# Patient Record
Sex: Male | Born: 1960 | Race: Black or African American | Hispanic: No | Marital: Single | Smoking: Current some day smoker
Health system: Southern US, Community
[De-identification: ages and names within clinical notes are randomized; demographics above are authoritative.]

## PROBLEM LIST (undated history)

## (undated) DIAGNOSIS — F419 Anxiety disorder, unspecified: Secondary | ICD-10-CM

## (undated) DIAGNOSIS — E7800 Pure hypercholesterolemia, unspecified: Secondary | ICD-10-CM

## (undated) DIAGNOSIS — M549 Dorsalgia, unspecified: Secondary | ICD-10-CM

## (undated) DIAGNOSIS — I10 Essential (primary) hypertension: Secondary | ICD-10-CM

---

## 2003-11-27 ENCOUNTER — Emergency Department: Payer: Self-pay | Admitting: Emergency Medicine

## 2003-12-01 ENCOUNTER — Ambulatory Visit: Payer: Self-pay | Admitting: *Deleted

## 2003-12-14 ENCOUNTER — Emergency Department: Payer: Self-pay | Admitting: Emergency Medicine

## 2004-03-15 ENCOUNTER — Ambulatory Visit: Payer: Self-pay

## 2005-04-21 ENCOUNTER — Emergency Department: Payer: Self-pay | Admitting: General Practice

## 2007-06-01 ENCOUNTER — Emergency Department: Payer: Self-pay | Admitting: Emergency Medicine

## 2007-08-22 ENCOUNTER — Emergency Department: Payer: Self-pay | Admitting: Emergency Medicine

## 2009-06-17 ENCOUNTER — Emergency Department: Payer: Self-pay | Admitting: Emergency Medicine

## 2009-08-21 ENCOUNTER — Emergency Department: Payer: Self-pay | Admitting: Emergency Medicine

## 2010-02-26 ENCOUNTER — Emergency Department: Payer: Self-pay | Admitting: Internal Medicine

## 2010-05-16 ENCOUNTER — Emergency Department: Payer: Self-pay | Admitting: Emergency Medicine

## 2012-08-08 ENCOUNTER — Ambulatory Visit: Payer: Self-pay | Admitting: Orthopedic Surgery

## 2016-11-20 ENCOUNTER — Encounter: Payer: Self-pay | Admitting: Emergency Medicine

## 2016-11-20 ENCOUNTER — Emergency Department
Admission: EM | Admit: 2016-11-20 | Discharge: 2016-11-20 | Disposition: A | Discharge status: Home / Self Care | Payer: 59 | Attending: Emergency Medicine | Admitting: Emergency Medicine

## 2016-11-20 DIAGNOSIS — M62838 Other muscle spasm: Secondary | ICD-10-CM

## 2016-11-20 DIAGNOSIS — H579 Unspecified disorder of eye and adnexa: Secondary | ICD-10-CM | POA: Diagnosis present

## 2016-11-20 DIAGNOSIS — H1132 Conjunctival hemorrhage, left eye: Secondary | ICD-10-CM | POA: Diagnosis not present

## 2016-11-20 DIAGNOSIS — Z87891 Personal history of nicotine dependence: Secondary | ICD-10-CM | POA: Insufficient documentation

## 2016-11-20 DIAGNOSIS — J302 Other seasonal allergic rhinitis: Secondary | ICD-10-CM | POA: Diagnosis not present

## 2016-11-20 HISTORY — DX: Dorsalgia, unspecified: M54.9

## 2016-11-20 MED ORDER — BACLOFEN 10 MG PO TABS: 10.0000 mg | ORAL_TABLET | Freq: Every day | ORAL | 1 refills | Status: AC

## 2016-11-20 NOTE — ED Triage Notes (Signed)
Pt states that he woke up on Friday with bleeding in his left eye. Pt states that it has gotten worse over the past few days. Pt denies pain or visual changes. Pt is in NAD at this time.

## 2016-11-20 NOTE — ED Provider Notes (Signed)
[facility] Emergency Department Provider Note  ____________________________________________   First MD Initiated Contact with Patient 11/20/16 1004     (approximate)  I have reviewed the triage vital signs and the nursing notes.   HISTORY  Chief Complaint eye redness    HPI Austin Sexton is a 56 y.o. male complains of left eye redness. States he was sneezing and then noticed he had a red eye. No drainage from the eye. No change in vision. Is not on any blood thinners. Also states his neck is hurting. Has a lot of chronic back pain. Feels like his muscles have tightened up. Also questionably has a sinus infection. Denies fever chills, cough or congestion. Just facial pressure. Has history of hypertension   Past Medical History:  Diagnosis Date  . Back pain     There are no active problems to display for this patient.   History reviewed. No pertinent surgical history.  Prior to Admission medications   Medication Sig Start Date End Date Taking? Authorizing Provider  baclofen (LIORESAL) 10 MG tablet Take 1 tablet (10 mg total) by mouth daily. 11/20/16 11/20/17  Faythe GheeFisher, Jawuan Robb W, PA-C    Allergies Patient has no known allergies.  No family history on file.  Social History Social History  Substance Use Topics  . Smoking status: Former Games developermoker  . Smokeless tobacco: Never Used  . Alcohol use Yes     Comment: social     Review of Systems  Constitutional: No fever/chills Eyes: No visual changes. Positive for red eye ENT: No sore throat. Hostage sinus pressure Respiratory: Denies cough Genitourinary: Negative for dysuria. Musculoskeletal: Positive for chronic back pain. Positive for neck tightness Skin: Negative for rash.    ____________________________________________   PHYSICAL EXAM:  VITAL SIGNS: ED Triage Vitals [11/20/16 0958]  Enc Vitals Group     BP (!) 145/86     Pulse Rate 97     Resp 16     Temp 97.8 F (36.6 C)    Temp Source Oral     SpO2 100 %     Weight 201 lb (91.2 kg)     Height 6' 4.5" (1.943 m)     Head Circumference      Peak Flow      Pain Score      Pain Loc      Pain Edu?      Excl. in GC?     Constitutional: Alert and oriented. Well appearing and in no acute distress. Eyes: Conjunctivae of the left eye is positive for hemorrhage..  Head: Atraumatic. Nose: No congestion/rhinnorhea. Nasal mucosa is boggy Mouth/Throat: Mucous membranes are moist.   Cardiovascular: Normal rate, regular rhythm. Respiratory: Normal respiratory effort.  No retractions GU: deferred Musculoskeletal: FROM all extremities, warm and well perfused. Muscle spasms in the upper shoulders. Full range of motion of the neck. Grips equal bilaterally Neurologic:  Normal speech and language.  Skin:  Skin is warm, dry and intact. No rash noted. Psychiatric: Mood and affect are normal. Speech and behavior are normal.  ____________________________________________   LABS (all labs ordered are listed, but only abnormal results are displayed)  Labs Reviewed - No data to display ____________________________________________   ____________________________________________  RADIOLOGY    ____________________________________________   PROCEDURES  Procedure(s) performed: No      ____________________________________________   INITIAL IMPRESSION / ASSESSMENT AND PLAN / ED COURSE  Pertinent labs & imaging results that were available during my care of the patient were  reviewed by me and considered in my medical decision making (see chart for details).  MR Carlini is a 56 year old well appearing male. He has multiple complaints today. 1.He has a subconjunctival hemorrhage which was his reason for coming to the emergency department. 2. He has neck spasms and chronic back pain. Gave the patient a prescription for baclofen for the muscle spasms. He is already taking Mobic.  3.  And he has sinus pressure which is  due to seasonal allergies.   patient was given instructions and follow-up care. Reassurance is given for subconjunctival hemorrhage. Instructed to take the over-the-counter allergy meds   ____________________________________________   FINAL CLINICAL IMPRESSION(S) / ED DIAGNOSES  Final diagnoses:  Subconjunctival hemorrhage of left eye  Muscle spasms of neck  Seasonal allergies      NEW MEDICATIONS STARTED DURING THIS VISIT:  Discharge Medication List as of 11/20/2016 10:15 AM    START taking these medications   Details  baclofen (LIORESAL) 10 MG tablet Take 1 tablet (10 mg total) by mouth daily., Starting Sun 11/20/2016, Until Mon 11/20/2017, Print         Note:  This document was prepared using Dragon voice recognition software and may include unintentional dictation errors.    Faythe Ghee, PA-C 11/20/16 1031    Dionne Bucy, MD 11/20/16 1538

## 2018-07-02 ENCOUNTER — Other Ambulatory Visit: Payer: Self-pay | Admitting: Physical Medicine and Rehabilitation

## 2018-07-02 DIAGNOSIS — M5416 Radiculopathy, lumbar region: Secondary | ICD-10-CM

## 2018-07-08 ENCOUNTER — Ambulatory Visit: Admission: RE | Admit: 2018-07-08 | Payer: 59 | Source: Ambulatory Visit

## 2018-07-09 ENCOUNTER — Other Ambulatory Visit: Payer: Self-pay | Admitting: Family Medicine

## 2018-07-09 DIAGNOSIS — M5416 Radiculopathy, lumbar region: Secondary | ICD-10-CM

## 2018-07-16 ENCOUNTER — Ambulatory Visit
Admission: RE | Admit: 2018-07-16 | Discharge: 2018-07-16 | Disposition: A | Discharge status: Home / Self Care | Payer: 59 | Source: Ambulatory Visit | Attending: Physical Medicine and Rehabilitation | Admitting: Physical Medicine and Rehabilitation

## 2018-07-16 ENCOUNTER — Other Ambulatory Visit: Payer: Self-pay

## 2018-07-16 DIAGNOSIS — M5416 Radiculopathy, lumbar region: Secondary | ICD-10-CM

## 2018-07-19 ENCOUNTER — Other Ambulatory Visit: Payer: Self-pay | Admitting: Neurosurgery

## 2018-07-19 DIAGNOSIS — G8929 Other chronic pain: Secondary | ICD-10-CM

## 2018-07-25 ENCOUNTER — Ambulatory Visit: Admission: RE | Admit: 2018-07-25 | Payer: 59 | Source: Ambulatory Visit

## 2019-09-16 ENCOUNTER — Other Ambulatory Visit: Payer: Self-pay

## 2019-09-16 ENCOUNTER — Emergency Department
Admission: EM | Admit: 2019-09-16 | Discharge: 2019-09-16 | Disposition: A | Discharge status: Home / Self Care | Payer: HRSA Program | Attending: Emergency Medicine | Admitting: Emergency Medicine

## 2019-09-16 DIAGNOSIS — R509 Fever, unspecified: Secondary | ICD-10-CM | POA: Insufficient documentation

## 2019-09-16 DIAGNOSIS — R0982 Postnasal drip: Secondary | ICD-10-CM | POA: Insufficient documentation

## 2019-09-16 DIAGNOSIS — Z87891 Personal history of nicotine dependence: Secondary | ICD-10-CM | POA: Diagnosis not present

## 2019-09-16 DIAGNOSIS — M791 Myalgia, unspecified site: Secondary | ICD-10-CM | POA: Insufficient documentation

## 2019-09-16 DIAGNOSIS — R6883 Chills (without fever): Secondary | ICD-10-CM | POA: Diagnosis present

## 2019-09-16 DIAGNOSIS — R05 Cough: Secondary | ICD-10-CM | POA: Diagnosis not present

## 2019-09-16 DIAGNOSIS — R197 Diarrhea, unspecified: Secondary | ICD-10-CM | POA: Diagnosis not present

## 2019-09-16 DIAGNOSIS — U071 COVID-19: Secondary | ICD-10-CM | POA: Diagnosis not present

## 2019-09-16 LAB — RESP PANEL BY RT PCR (RSV, FLU A&B, COVID)
Influenza A by PCR: NEGATIVE
Influenza B by PCR: NEGATIVE
Respiratory Syncytial Virus by PCR: NEGATIVE
SARS Coronavirus 2 by RT PCR: POSITIVE — AB

## 2019-09-16 MED ORDER — AZITHROMYCIN 250 MG PO TABS: ORAL_TABLET | ORAL | 0 refills | Status: AC

## 2019-09-16 MED ORDER — PREDNISONE 10 MG PO TABS: ORAL_TABLET | ORAL | 0 refills | Status: AC

## 2019-09-16 MED ORDER — ACETAMINOPHEN 500 MG PO TABS
1000.0000 mg | ORAL_TABLET | Freq: Once | ORAL | Status: AC
  Administered 2019-09-16: 1000 mg via ORAL
  Filled 2019-09-16: qty 2

## 2019-09-16 NOTE — ED Triage Notes (Signed)
Pt comes via POV from home with c/o chills and body aches. Pt states this started 5 days ago.  Pt current temp is 102.3

## 2019-09-16 NOTE — ED Provider Notes (Signed)
[facility] Emergency Department Provider Note   ____________________________________________   First MD Initiated Contact with Patient 09/16/19 1239     (approximate)  I have reviewed the triage vital signs and the nursing notes.   HISTORY  Chief Complaint Chills and Generalized Body Aches    HPI Austin Sexton is a 59 y.o. male who presents to the emergency department for complaint of body ache, chills, cough, and diarrhea that have been present for approximately 5 days.  The patient is not Covid vaccinated.  He has no known sick contacts, though expresses that multiple people at work have had a cough.  He denies shortness of breath, chest pain, wheezing.  He has no history of COPD, asthma, or other breathing problems.         Past Medical History:  Diagnosis Date  . Back pain     There are no problems to display for this patient.   History reviewed. No pertinent surgical history.  Prior to Admission medications   Medication Sig Start Date End Date Taking? Authorizing Provider  azithromycin (ZITHROMAX Z-PAK) 250 MG tablet Take 2 tablets (500 mg) on  Day 1,  followed by 1 tablet (250 mg) once daily on Days 2 through 5. 09/16/19 09/21/19  Masyn Fullam, Ruben Gottron, PA  predniSONE (DELTASONE) 10 MG tablet Take 6 tablets (60 mg total) by mouth daily for 1 day, THEN 5 tablets (50 mg total) daily for 1 day, THEN 4 tablets (40 mg total) daily for 1 day, THEN 3 tablets (30 mg total) daily for 1 day, THEN 2 tablets (20 mg total) daily for 1 day, THEN 1 tablet (10 mg total) daily for 1 day. 09/16/19 09/22/19  Lucy Chris, PA    Allergies Patient has no known allergies.  No family history on file.  Social History Social History   Tobacco Use  . Smoking status: Former Games developer  . Smokeless tobacco: Never Used  Substance Use Topics  . Alcohol use: Yes    Comment: social   . Drug use: No    Review of Systems  Constitutional: + Fever,+ chills, +  body aches Eyes: No visual changes. ENT: No sore throat. Cardiovascular: Denies chest pain. Respiratory: Denies shortness of breath. Gastrointestinal: No abdominal pain.  No nausea, no vomiting. + diarrhea.  No constipation. Musculoskeletal: Negative for back pain. Skin: Negative for rash. Neurological: Negative for headaches, focal weakness or numbness.   ____________________________________________   PHYSICAL EXAM:  VITAL SIGNS: ED Triage Vitals  Enc Vitals Group     BP 09/16/19 1140 (!) 154/117     Pulse Rate 09/16/19 1140 (!) 102     Resp 09/16/19 1140 18     Temp 09/16/19 1140 (!) 102.3 F (39.1 C)     Temp Source 09/16/19 1345 Oral     SpO2 09/16/19 1140 97 %     Weight 09/16/19 1141 190 lb (86.2 kg)     Height 09/16/19 1141 6\' 5"  (1.956 m)     Head Circumference --      Peak Flow --      Pain Score 09/16/19 1141 6     Pain Loc --      Pain Edu? --      Excl. in GC? --     Constitutional: Alert and oriented. Well appearing and in no acute distress. Eyes: Conjunctivae are normal.  Head: Atraumatic. Nose: + Rhinorrhea Mouth/Throat: Mucous membranes are moist.  Oropharynx erythematous Neck: No stridor. Cardiovascular: Normal  rate, regular rhythm. Grossly normal heart sounds.  Good peripheral circulation. Respiratory: Normal respiratory effort.  No retractions. Lungs CTAB. Gastrointestinal: Soft and nontender. No distention.  Musculoskeletal: No lower extremity tenderness nor edema.  No joint effusions. Neurologic:  Normal speech and language. No gross focal neurologic deficits are appreciated. No gait instability. Skin:  Skin is warm, dry and intact. No rash noted. Psychiatric: Mood and affect are normal. Speech and behavior are normal.  ____________________________________________   LABS (all labs ordered are listed, but only abnormal results are displayed)  Labs Reviewed  RESP PANEL BY RT PCR (RSV, FLU A&B, COVID) - Abnormal; Notable for the following  components:      Result Value   SARS Coronavirus 2 by RT PCR POSITIVE (*)    All other components within normal limits     ____________________________________________   INITIAL IMPRESSION / ASSESSMENT AND PLAN / ED COURSE  As part of my medical decision making, I reviewed the following data within the electronic MEDICAL RECORD NUMBER Nursing notes reviewed and incorporated        Austin Sexton is a 59 year old male who presents today for evaluation of flu-like symptoms.  The patient has no known sick contacts, but his swab today for Covid.  He is Covid positive.  He responded well to treatment of Tylenol given in triage, which he had not previously been using at home.  Given reassuring response to Tylenol, reassuring exam particularly of the lungs feel this patient is stable for outpatient management of COVID-19.  The patient will continue to take Tylenol every 4-6 hours 500 mg as needed for pain and fever.  The patient was also prescribed prednisone and Z-Pak and their limited use was described to the patient.  The patient will remain out of work for an additional 5 to 7 days, and was given a work note for this.  Patient was given follow-up instructions and will come to the emergency department should they experience increased work of breathing, shortness of breath, chest pain, or other worsening Covid symptoms.  Austin Sexton was evaluated in Emergency Department on 09/16/2019 for the symptoms described in the history of present illness. He was evaluated in the context of the global COVID-19 pandemic, which necessitated consideration that the patient might be at risk for infection with the SARS-CoV-2 virus that causes COVID-19. Institutional protocols and algorithms that pertain to the evaluation of patients at risk for COVID-19 are in a state of rapid change based on information released by regulatory bodies including the CDC and federal and state organizations. These policies and algorithms  were followed during the patient's care in the ED.       ____________________________________________   FINAL CLINICAL IMPRESSION(S) / ED DIAGNOSES  Final diagnoses:  COVID-19     ED Discharge Orders         Ordered    azithromycin (ZITHROMAX Z-PAK) 250 MG tablet     Discontinue  Reprint     09/16/19 1355    predniSONE (DELTASONE) 10 MG tablet     Discontinue  Reprint     09/16/19 1355           Note:  This document was prepared using Dragon voice recognition software and may include unintentional dictation errors.    Lucy Chris, PA 09/16/19 1533    Sharman Cheek, MD 09/17/19 1527

## 2019-09-16 NOTE — Discharge Instructions (Signed)
Please use tylenol 500mg  every 4-6 hours as needed for body aches and fever. Please return to the ER for any difficulty breathing or shortness of breath.

## 2019-09-16 NOTE — ED Notes (Signed)
See triage note  Presents with body aches and fever  States he started to feel bad about 5 days ago

## 2020-01-09 ENCOUNTER — Emergency Department: Payer: Self-pay

## 2020-01-09 ENCOUNTER — Other Ambulatory Visit: Payer: Self-pay

## 2020-01-09 ENCOUNTER — Emergency Department
Admission: EM | Admit: 2020-01-09 | Discharge: 2020-01-09 | Disposition: A | Discharge status: Home / Self Care | Payer: Self-pay | Attending: Emergency Medicine | Admitting: Emergency Medicine

## 2020-01-09 ENCOUNTER — Encounter: Payer: Self-pay | Admitting: Emergency Medicine

## 2020-01-09 DIAGNOSIS — Z87891 Personal history of nicotine dependence: Secondary | ICD-10-CM | POA: Insufficient documentation

## 2020-01-09 DIAGNOSIS — I10 Essential (primary) hypertension: Secondary | ICD-10-CM | POA: Insufficient documentation

## 2020-01-09 DIAGNOSIS — M5030 Other cervical disc degeneration, unspecified cervical region: Secondary | ICD-10-CM | POA: Insufficient documentation

## 2020-01-09 DIAGNOSIS — M5412 Radiculopathy, cervical region: Secondary | ICD-10-CM | POA: Insufficient documentation

## 2020-01-09 HISTORY — DX: Essential (primary) hypertension: I10

## 2020-01-09 MED ORDER — KETOROLAC TROMETHAMINE 30 MG/ML IJ SOLN
30.0000 mg | Freq: Once | INTRAMUSCULAR | Status: AC
  Administered 2020-01-09: 30 mg via INTRAMUSCULAR
  Filled 2020-01-09: qty 1

## 2020-01-09 MED ORDER — KETOROLAC TROMETHAMINE 10 MG PO TABS: 10.0000 mg | ORAL_TABLET | Freq: Three times a day (TID) | ORAL | 0 refills | Status: DC

## 2020-01-09 MED ORDER — CYCLOBENZAPRINE HCL 5 MG PO TABS: 5.0000 mg | ORAL_TABLET | Freq: Three times a day (TID) | ORAL | 0 refills | Status: DC | PRN

## 2020-01-09 MED ORDER — LIDOCAINE 5 % EX PTCH: 1.0000 | MEDICATED_PATCH | Freq: Two times a day (BID) | CUTANEOUS | 0 refills | Status: AC | PRN

## 2020-01-09 NOTE — Discharge Instructions (Signed)
Take the prescription meds as directed. Follow-up with [facility] for referral to Neurosurgery or [facility]. Return if needed.

## 2020-01-09 NOTE — ED Provider Notes (Signed)
[facility] Emergency Department Provider Note ____________________________________________  Time seen: 1016  I have reviewed the triage vital signs and the nursing notes.  HISTORY  Chief Complaint  Neck Pain (And left arm and shoulder)   HPI Austin Sexton is a 59 y.o. male presents himself to the ED for evaluation of several weeks of intermittent left upper extremity pain with referral from the left lateral neck.  He denies any preceding injury, trauma, fall.  He also denies any chest pain or shortness of breath.  Patient has noted some weakness in his grip on the left.  He does have remote history of degenerative disc disease to the lower back which was treated conservatively.  He describes his work activities requiring him to keep his neck in a forward flexed position through the majority of the day and do upper extremity repetitive motion work activities.  He presents for evaluation of his symptoms and denies any other concerning complaints.   Past Medical History:  Diagnosis Date  . Back pain   . Hypertension     There are no problems to display for this patient.   History reviewed. No pertinent surgical history.  Prior to Admission medications   Medication Sig Start Date End Date Taking? Authorizing Provider  cyclobenzaprine (FLEXERIL) 5 MG tablet Take 1 tablet (5 mg total) by mouth 3 (three) times daily as needed. 01/09/20   Kathryne Ramella, Charlesetta Ivory, PA-C  ketorolac (TORADOL) 10 MG tablet Take 1 tablet (10 mg total) by mouth every 8 (eight) hours. 01/09/20   Evalynne Locurto, Charlesetta Ivory, PA-C  lidocaine (LIDODERM) 5 % Place 1 patch onto the skin every 12 (twelve) hours as needed for up to 10 days. Remove & Discard patch after 12 hours of wear each day. 01/09/20 01/19/20  Marielouise Amey, Charlesetta Ivory, PA-C    Allergies Patient has no known allergies.  History reviewed. No pertinent family history.  Social History Social History   Tobacco Use  . Smoking  status: Former Smoker    Quit date: 01/31/1994    Years since quitting: 25.9  . Smokeless tobacco: Never Used  Vaping Use  . Vaping Use: Never used  Substance Use Topics  . Alcohol use: Yes    Comment: social   . Drug use: No    Review of Systems  Constitutional: Negative for fever. Eyes: Negative for visual changes. ENT: Negative for sore throat. Cardiovascular: Negative for chest pain. Respiratory: Negative for shortness of breath. Gastrointestinal: Negative for abdominal pain, vomiting and diarrhea. Genitourinary: Negative for dysuria. Musculoskeletal: Negative for back pain.  Reports left neck and upper extremity pain as above. Skin: Negative for rash. Neurological: Negative for headaches, focal weakness or numbness. ____________________________________________  PHYSICAL EXAM:  VITAL SIGNS: ED Triage Vitals  Enc Vitals Group     BP 01/09/20 0904 (!) 161/80     Pulse Rate 01/09/20 0904 95     Resp 01/09/20 0904 18     Temp 01/09/20 0904 98.3 F (36.8 C)     Temp Source 01/09/20 0904 Oral     SpO2 01/09/20 0904 100 %     Weight 01/09/20 0859 196 lb (88.9 kg)     Height 01/09/20 0859 6\' 5"  (1.956 m)     Head Circumference --      Peak Flow --      Pain Score 01/09/20 0921 9     Pain Loc --      Pain Edu? --  Excl. in GC? --     Constitutional: Alert and oriented. Well appearing and in no distress. Head: Normocephalic and atraumatic. Eyes: Conjunctivae are normal. Normal extraocular movements Neck: Supple.  Normal range of motion without crepitus.  No distracting midline tenderness is noted.  Patient tender to palpation to the left lateral paraspinal musculature. Cardiovascular: Normal rate, regular rhythm. Normal distal pulses. Respiratory: Normal respiratory effort. No wheezes/rales/rhonchi. Musculoskeletal: Normal spinal alignment without midline tenderness, spasm, deformity, or step-off.  Patient is again tender to palpation over the left lateral neck and  left upper trapezius musculature.  No deficit in shoulder range of motion.  No rotator cuff weakness is appreciated.  Nontender with normal range of motion in all extremities.  Neurologic: Cranial nerves II through XII grossly intact.  Normal UV DTRs bilaterally.  Normal intrinsic and opposition testing noted.  Normal gait without ataxia. Normal speech and language. No gross focal neurologic deficits are appreciated. Skin:  Skin is warm, dry and intact. No rash noted. Psychiatric: Mood and affect are normal. Patient exhibits appropriate insight and judgment. ____________________________________________   RADIOLOGY  DG Cervical Spine  IMPRESSION: Chronic degenerative spondylosis from C2-3 through C6-7. Mild bony foraminal encroachment bilaterally throughout that region.  MR Cervical Spine (07/2012)  IMPRESSION:   1. Multilevel degenerative disc disease changes as described above with  areas of thecal sac stenosis and neural foraminal narrowing. There are  findings concerning for exiting nerve root compromise. These findings are  also secondary to components of endplate hypertrophic spurring and facet  hypertrophy.   ____________________________________________  PROCEDURES  Toradol 30 mg IM  Procedures ____________________________________________  INITIAL IMPRESSION / ASSESSMENT AND PLAN / ED COURSE  Patient with ED evaluation of left-sided neck pain with referral into the left upper extremity.  Patient clinical picture is concerning for likely radiculopathy given his history of lumbar DDD.  X-ray did reveal significant DDD changes throughout the cervical spine.  He is without any acute neuromuscular deficit on exam.  He will be discharged with prescriptions for ketorolac and cyclobenzaprine as well as Lidoderm patches.  He is encouraged to follow with primary provider for referral to neurology versus orthospine specialty.  Return precautions have been discussed.  Austin Sexton  was evaluated in Emergency Department on 01/09/2020 for the symptoms described in the history of present illness. He was evaluated in the context of the global COVID-19 pandemic, which necessitated consideration that the patient might be at risk for infection with the SARS-CoV-2 virus that causes COVID-19. Institutional protocols and algorithms that pertain to the evaluation of patients at risk for COVID-19 are in a state of rapid change based on information released by regulatory bodies including the CDC and federal and state organizations. These policies and algorithms were followed during the patient's care in the ED. ____________________________________________  FINAL CLINICAL IMPRESSION(S) / ED DIAGNOSES  Final diagnoses:  Cervical radiculopathy  DDD (degenerative disc disease), cervical      Seara Hinesley, Charlesetta Ivory, PA-C 01/09/20 1207    Merwyn Katos, MD 01/09/20 [phone]

## 2020-01-09 NOTE — ED Triage Notes (Signed)
Pt via pov from home with left neck, shoulder, arm pain. Pt has hx of the same, approx 2-3 years ago. Pt denies any cp, sob, n/v. Pt alert & oriented, nad noted.

## 2020-03-11 ENCOUNTER — Emergency Department: Payer: Self-pay

## 2020-03-11 ENCOUNTER — Emergency Department
Admission: EM | Admit: 2020-03-11 | Discharge: 2020-03-11 | Disposition: A | Discharge status: Home / Self Care | Payer: Self-pay | Attending: Emergency Medicine | Admitting: Emergency Medicine

## 2020-03-11 DIAGNOSIS — I10 Essential (primary) hypertension: Secondary | ICD-10-CM | POA: Insufficient documentation

## 2020-03-11 DIAGNOSIS — R739 Hyperglycemia, unspecified: Secondary | ICD-10-CM | POA: Insufficient documentation

## 2020-03-11 DIAGNOSIS — E876 Hypokalemia: Secondary | ICD-10-CM | POA: Insufficient documentation

## 2020-03-11 DIAGNOSIS — R42 Dizziness and giddiness: Secondary | ICD-10-CM | POA: Insufficient documentation

## 2020-03-11 DIAGNOSIS — Z760 Encounter for issue of repeat prescription: Secondary | ICD-10-CM | POA: Insufficient documentation

## 2020-03-11 DIAGNOSIS — Z87891 Personal history of nicotine dependence: Secondary | ICD-10-CM | POA: Insufficient documentation

## 2020-03-11 DIAGNOSIS — Z79899 Other long term (current) drug therapy: Secondary | ICD-10-CM | POA: Insufficient documentation

## 2020-03-11 LAB — COMPREHENSIVE METABOLIC PANEL
ALT: 31 U/L (ref 0–44)
AST: 35 U/L (ref 15–41)
Albumin: 4 g/dL (ref 3.5–5.0)
Alkaline Phosphatase: 62 U/L (ref 38–126)
Anion gap: 12 (ref 5–15)
BUN: 15 mg/dL (ref 6–20)
CO2: 22 mmol/L (ref 22–32)
Calcium: 9.1 mg/dL (ref 8.9–10.3)
Chloride: 107 mmol/L (ref 98–111)
Creatinine, Ser: 1.07 mg/dL (ref 0.61–1.24)
GFR, Estimated: >60 mL/min (ref >60)
Glucose, Bld: 216 mg/dL — ABNORMAL HIGH (ref 70–99)
Potassium: 2.8 mmol/L — ABNORMAL LOW (ref 3.5–5.1)
Sodium: 141 mmol/L (ref 135–145)
Total Bilirubin: 0.6 mg/dL (ref 0.3–1.2)
Total Protein: 7.6 g/dL (ref 6.5–8.1)

## 2020-03-11 LAB — CBC WITH DIFFERENTIAL/PLATELET
Abs Immature Granulocytes: 0.09 10*3/uL — ABNORMAL HIGH (ref 0.00–0.07)
Basophils Absolute: 0 10*3/uL (ref 0.0–0.1)
Basophils Relative: 0 %
Eosinophils Absolute: 0.2 10*3/uL (ref 0.0–0.5)
Eosinophils Relative: 2 %
HCT: 39.1 % (ref 39.0–52.0)
Hemoglobin: 13.6 g/dL (ref 13.0–17.0)
Immature Granulocytes: 1 %
Lymphocytes Relative: 45 %
Lymphs Abs: 4 10*3/uL (ref 0.7–4.0)
MCH: 30.2 pg (ref 26.0–34.0)
MCHC: 34.8 g/dL (ref 30.0–36.0)
MCV: 86.7 fL (ref 80.0–100.0)
Monocytes Absolute: 0.7 10*3/uL (ref 0.1–1.0)
Monocytes Relative: 8 %
Neutro Abs: 4 10*3/uL (ref 1.7–7.7)
Neutrophils Relative %: 44 %
Platelets: 319 10*3/uL (ref 150–400)
RBC: 4.51 MIL/uL (ref 4.22–5.81)
RDW: 14.6 % (ref 11.5–15.5)
WBC: 9 10*3/uL (ref 4.0–10.5)
nRBC: 0 % (ref 0.0–0.2)

## 2020-03-11 LAB — HEMOGLOBIN A1C
Hgb A1c MFr Bld: 5.6 % (ref 4.8–5.6)
Mean Plasma Glucose: 114.02 mg/dL

## 2020-03-11 LAB — MAGNESIUM: Magnesium: 1.8 mg/dL (ref 1.7–2.4)

## 2020-03-11 LAB — TROPONIN I (HIGH SENSITIVITY): Troponin I (High Sensitivity): 7 ng/L (ref <18)

## 2020-03-11 LAB — TSH: TSH: 2.21 u[IU]/mL (ref 0.350–4.500)

## 2020-03-11 MED ORDER — POTASSIUM CHLORIDE CRYS ER 20 MEQ PO TBCR
80.0000 meq | EXTENDED_RELEASE_TABLET | Freq: Once | ORAL | Status: AC
  Administered 2020-03-11: 80 meq via ORAL
  Filled 2020-03-11: qty 4

## 2020-03-11 MED ORDER — HYDROCHLOROTHIAZIDE 25 MG PO TABS
12.5000 mg | ORAL_TABLET | ORAL | Status: AC
  Administered 2020-03-11: 12.5 mg via ORAL
  Filled 2020-03-11: qty 1

## 2020-03-11 MED ORDER — LACTATED RINGERS IV BOLUS
500.0000 mL | Freq: Once | INTRAVENOUS | Status: AC
  Administered 2020-03-11: 500 mL via INTRAVENOUS

## 2020-03-11 MED ORDER — HYDROCHLOROTHIAZIDE 12.5 MG PO CAPS: 12.5000 mg | ORAL_CAPSULE | Freq: Two times a day (BID) | ORAL | 0 refills | Status: DC

## 2020-03-11 MED ORDER — LABETALOL HCL 5 MG/ML IV SOLN
10.0000 mg | Freq: Once | INTRAVENOUS | Status: AC
  Administered 2020-03-11: 10 mg via INTRAVENOUS
  Filled 2020-03-11: qty 4

## 2020-03-11 NOTE — ED Provider Notes (Signed)
[facility] Emergency Department Provider Note  ____________________________________________   Event Date/Time   First MD Initiated Contact with Patient 03/11/20 [phone]     (approximate)  I have reviewed the triage vital signs and the nursing notes.   HISTORY  Chief Complaint Hypertension   HPI Austin Sexton is a 60 y.o. male with a past medical history of chronic back pain and hypertension having run out of his BP meds approximately 2 to 3 weeks ago who presents EMS from home for assessment of worsening dizziness of the last 2 or 3 days associate with "feeling very tense" and "under lot of stress".  He notes he has had some congestion of last couple days and has been using intranasal over-the-counter anticongestions.  Patient states his dizziness seems to get worse whenever he moves although he has been so dizzy he feels he cannot walk straight.  He denies any headache, vision changes, chest pain, cough, shortness of breath, dental pain, nausea, vomiting, diarrhea, dysuria, focal extremity weakness numbness or tingling, rash, recent falls or injuries.  He denies any EtOH use or illicit drug use.  Endorses remote tobacco abuse but no recent tobacco abuse.  No clear alleviating aggravating factors of movement.  No prior similar episodes.         Past Medical History:  Diagnosis Date  . Back pain   . Hypertension     There are no problems to display for this patient.   History reviewed. No pertinent surgical history.  Prior to Admission medications   Medication Sig Start Date End Date Taking? Authorizing Provider  hydrochlorothiazide (MICROZIDE) 12.5 MG capsule Take 1 capsule (12.5 mg total) by mouth 2 (two) times daily. 03/11/20 04/10/20 Yes Gilles Chiquito, MD  cyclobenzaprine (FLEXERIL) 5 MG tablet Take 1 tablet (5 mg total) by mouth 3 (three) times daily as needed. 01/09/20   Menshew, Charlesetta Ivory, PA-C  ketorolac (TORADOL) 10 MG tablet Take 1  tablet (10 mg total) by mouth every 8 (eight) hours. 01/09/20   Menshew, Charlesetta Ivory, PA-C    Allergies Patient has no known allergies.  History reviewed. No pertinent family history.  Social History Social History   Tobacco Use  . Smoking status: Former Smoker    Quit date: 01/31/1994    Years since quitting: 26.1  . Smokeless tobacco: Never Used  Vaping Use  . Vaping Use: Never used  Substance Use Topics  . Alcohol use: Yes    Comment: social   . Drug use: No    Review of Systems  Review of Systems  Constitutional: Positive for malaise/fatigue. Negative for chills and fever.  HENT: Negative for sore throat.   Eyes: Negative for pain.  Respiratory: Negative for cough and stridor.   Cardiovascular: Negative for chest pain.  Gastrointestinal: Negative for vomiting.  Genitourinary: Negative for dysuria.  Musculoskeletal: Negative for myalgias.  Skin: Negative for rash.  Neurological: Positive for dizziness. Negative for seizures, loss of consciousness and headaches.  Psychiatric/Behavioral: Negative for suicidal ideas.  All other systems reviewed and are negative.     ____________________________________________   PHYSICAL EXAM:  VITAL SIGNS: ED Triage Vitals  Enc Vitals Group     BP      Pulse      Resp      Temp      Temp src      SpO2      Weight      Height  Head Circumference      Peak Flow      Pain Score      Pain Loc      Pain Edu?      Excl. in GC?    Vitals:   03/11/20 0542 03/11/20 0630  BP: (!) 144/90 (!) 147/97  Pulse: 79 81  Resp: 16 16  Temp:    SpO2:  100%   Physical Exam Vitals and nursing note reviewed.  Constitutional:      Appearance: He is well-developed and well-nourished.  HENT:     Head: Normocephalic and atraumatic.     Right Ear: External ear normal.     Left Ear: External ear normal.     Nose: Nose normal.  Eyes:     Conjunctiva/sclera: Conjunctivae normal.  Cardiovascular:     Rate and Rhythm: Normal  rate and regular rhythm.     Heart sounds: No murmur heard.   Pulmonary:     Effort: Pulmonary effort is normal. No respiratory distress.     Breath sounds: Normal breath sounds.  Abdominal:     Palpations: Abdomen is soft.     Tenderness: There is no abdominal tenderness.  Musculoskeletal:        General: No edema.     Cervical back: Neck supple.  Skin:    General: Skin is warm and dry.     Capillary Refill: Capillary refill takes less than 2 seconds.  Neurological:     Mental Status: He is alert and oriented to person, place, and time.  Psychiatric:        Mood and Affect: Mood and affect and mood normal.     Cranial nerves II through XII grossly intact.  No pronator drift.  No finger dysmetria.  Symmetric 5/5 strength of all extremities.  Sensation intact to light touch in all extremities.  Unremarkable unassisted gait. ____________________________________________   LABS (all labs ordered are listed, but only abnormal results are displayed)  Labs Reviewed  CBC WITH DIFFERENTIAL/PLATELET - Abnormal; Notable for the following components:      Result Value   Abs Immature Granulocytes 0.09 (*)    All other components within normal limits  COMPREHENSIVE METABOLIC PANEL - Abnormal; Notable for the following components:   Potassium 2.8 (*)    Glucose, Bld 216 (*)    All other components within normal limits  TSH  MAGNESIUM  HEMOGLOBIN A1C  TROPONIN I (HIGH SENSITIVITY)   ____________________________________________  EKG  Sinus rhythm with a ventricular rate of 94, left axis deviation, and some nonspecific ST changes in inferior leads without other clear evidence of acute ischemia or other significant underlying arrhythmia. ____________________________________________  RADIOLOGY  ED MD interpretation: No evidence of CVA.  Official radiology report(s): MR BRAIN WO CONTRAST  Result Date: 03/11/2020 CLINICAL DATA:  60 year old male with dizziness, palpitations. Out of  blood pressure medication for 2-3 weeks. EXAM: MRI HEAD WITHOUT CONTRAST TECHNIQUE: Multiplanar, multiecho pulse sequences of the brain and surrounding structures were obtained without intravenous contrast. COMPARISON:  Head CT 02/26/2010. FINDINGS: Brain: Brachycephaly. Cerebral volume is within normal limits. No restricted diffusion to suggest acute infarction. No midline shift, mass effect, evidence of mass lesion, ventriculomegaly, extra-axial collection or acute intracranial hemorrhage. Cervicomedullary junction and pituitary are within normal limits. Wallace Cullens and white matter signal is within normal limits for age throughout the brain. No encephalomalacia or chronic cerebral blood products identified. Vascular: Major intracranial vascular flow voids are preserved. Skull and upper cervical spine: Evidence of C2-C3  disc and endplate degeneration. Otherwise negative. Normal bone marrow signal. Sinuses/Orbits: Negative orbits. Polypoid paranasal sinus mucosal thickening or mucous retention cysts are chronic, most pronounced in the left maxillary sinus. Other: Chronic nasal septal deviation. Grossly normal visible internal auditory structures. Mastoids are clear. Normal stylomastoid foramina. Scalp and face appear negative. IMPRESSION: 1. Normal for age noncontrast MRI appearance of the brain. 2. Chronic paranasal sinus retention cysts. Electronically Signed   By: Odessa Fleming M.D.   On: 03/11/2020 06:53    ____________________________________________   PROCEDURES  Procedure(s) performed (including Critical Care):  .1-3 Lead EKG Interpretation Performed by: Gilles Chiquito, MD Authorized by: Gilles Chiquito, MD     Interpretation: normal     ECG rate assessment: normal     Ectopy: none     Conduction: normal       ____________________________________________   INITIAL IMPRESSION / ASSESSMENT AND PLAN / ED COURSE        Patient presents with above to history exam for assessment of dizziness  over the last 2 to 3 days in the setting of being off his blood pressure medicines and feeling shaky and very tense.  Patient also notes he has been under a lot of stress lately as he recently lost his sister.  On arrival he is quite hypertensive with BP of 188/97.  While he is little bit tremulous he has a nonfocal neuro exam although he states he feels too dizzy to ambulate.  Primary differential includes but is not limited to hypertensive urgency, BPPV, labyrinthitis, posterior cerebellar stroke, metabolic derangements, dehydration, and other immediate life-threatening causes of acute ventricular syndrome.  MR shows no evidence of posterior cerebellar stroke.  CMP remarkable for K of 2.8 and a glucose of 216 with no significant derangements.  TSH WNL.  Magnesium WNL.  Troponin is nonelevated and given reassuring EKG with patient denying any chest pain suspicion for ACS or arrhythmia contributing to symptoms.  No focal deficit suggest CVA.  I did perform Epley maneuver patient's right side and he stated he felt slightly better after this and with some still persistent.  Mild vertigo.  Given history of congestion suspect patient may have possible labyrinthitis versus vestibular neuritis.  BPPV is also within the differential.  I suspect patient's very high blood pressures in the setting of taking his medications and using over-the-counter decongestant have also been contributing to his symptoms.  Advised him to stop using a decongestant.  He was given 1 dose of his home hydrochlorothiazide as well as 1 dose of labetalol and small IV fluid bolus and on reassessment he stated he felt much better was noted to ambulate with steady gait unassisted.  Blood pressure improved to 147/97 and he denies any additional complaints  Given stable vitals with otherwise reassuring exam and work-up including MRI I believe he is safe for discharge with plan for outpatient follow-up.  Refill for blood pressure medications  provided.  Again advised cessation of over-the-counter decongestants.  Patient voiced understanding and agreement this plan.  Discharge stable condition.  Strict return cautions advised and discussed.  Also advised that A1c was sent to this could be followed by PCP.  ____________________________________________   FINAL CLINICAL IMPRESSION(S) / ED DIAGNOSES  Final diagnoses:  Dizziness  Hypertension, unspecified type  Medication refill  Hypokalemia  Hyperglycemia    Medications  hydrochlorothiazide (HYDRODIURIL) tablet 12.5 mg (12.5 mg Oral Given 03/11/20 0513)  labetalol (NORMODYNE) injection 10 mg (10 mg Intravenous Given 03/11/20 0532)  potassium chloride  SA (KLOR-CON) CR tablet 80 mEq (80 mEq Oral Given 03/11/20 0540)  lactated ringers bolus 500 mL (500 mLs Intravenous New Bag/Given 03/11/20 7356)     ED Discharge Orders         Ordered    hydrochlorothiazide (MICROZIDE) 12.5 MG capsule  2 times daily        03/11/20 0544           Note:  This document was prepared using Dragon voice recognition software and may include unintentional dictation errors.   Gilles Chiquito, MD 03/11/20 [phone]

## 2020-03-11 NOTE — ED Triage Notes (Signed)
Pt reports hypertension and palpitations, states he has been out of his BP meds for 2-3 weeks. Unsure of the name of the medication. Denies CP/SOB. Reports recent increase in life stressors.

## 2021-05-28 ENCOUNTER — Other Ambulatory Visit: Payer: Self-pay

## 2021-05-28 ENCOUNTER — Emergency Department
Admission: EM | Admit: 2021-05-28 | Discharge: 2021-05-28 | Disposition: A | Discharge status: Home / Self Care | Payer: Self-pay | Attending: Emergency Medicine | Admitting: Emergency Medicine

## 2021-05-28 DIAGNOSIS — F418 Other specified anxiety disorders: Secondary | ICD-10-CM

## 2021-05-28 DIAGNOSIS — I10 Essential (primary) hypertension: Secondary | ICD-10-CM | POA: Insufficient documentation

## 2021-05-28 DIAGNOSIS — F419 Anxiety disorder, unspecified: Secondary | ICD-10-CM | POA: Insufficient documentation

## 2021-05-28 LAB — COMPREHENSIVE METABOLIC PANEL
ALT: 32 U/L (ref 0–44)
AST: 20 U/L (ref 15–41)
Albumin: 4 g/dL (ref 3.5–5.0)
Alkaline Phosphatase: 59 U/L (ref 38–126)
Anion gap: 8 (ref 5–15)
BUN: 19 mg/dL (ref 6–20)
CO2: 24 mmol/L (ref 22–32)
Calcium: 9.2 mg/dL (ref 8.9–10.3)
Chloride: 104 mmol/L (ref 98–111)
Creatinine, Ser: 1.05 mg/dL (ref 0.61–1.24)
GFR, Estimated: >60 mL/min (ref >60)
Glucose, Bld: 110 mg/dL — ABNORMAL HIGH (ref 70–99)
Potassium: 3.4 mmol/L — ABNORMAL LOW (ref 3.5–5.1)
Sodium: 136 mmol/L (ref 135–145)
Total Bilirubin: 0.6 mg/dL (ref 0.3–1.2)
Total Protein: 7.7 g/dL (ref 6.5–8.1)

## 2021-05-28 LAB — CBC WITH DIFFERENTIAL/PLATELET
Abs Immature Granulocytes: 0.03 10*3/uL (ref 0.00–0.07)
Basophils Absolute: 0 10*3/uL (ref 0.0–0.1)
Basophils Relative: 1 %
Eosinophils Absolute: 0.1 10*3/uL (ref 0.0–0.5)
Eosinophils Relative: 1 %
HCT: 40.1 % (ref 39.0–52.0)
Hemoglobin: 13.5 g/dL (ref 13.0–17.0)
Immature Granulocytes: 1 %
Lymphocytes Relative: 29 %
Lymphs Abs: 1.9 10*3/uL (ref 0.7–4.0)
MCH: 29.7 pg (ref 26.0–34.0)
MCHC: 33.7 g/dL (ref 30.0–36.0)
MCV: 88.1 fL (ref 80.0–100.0)
Monocytes Absolute: 0.3 10*3/uL (ref 0.1–1.0)
Monocytes Relative: 5 %
Neutro Abs: 4 10*3/uL (ref 1.7–7.7)
Neutrophils Relative %: 63 %
Platelets: 293 10*3/uL (ref 150–400)
RBC: 4.55 MIL/uL (ref 4.22–5.81)
RDW: 14.4 % (ref 11.5–15.5)
WBC: 6.4 10*3/uL (ref 4.0–10.5)
nRBC: 0 % (ref 0.0–0.2)

## 2021-05-28 MED ORDER — LORAZEPAM 0.5 MG PO TABS: 0.5000 mg | ORAL_TABLET | Freq: Every evening | ORAL | 0 refills | Status: AC | PRN

## 2021-05-28 NOTE — ED Triage Notes (Signed)
Patient to ER via POV with complaints of worsening anxiety, reports he feels like his heart is beating rapidly. States that this has been happening for around a month. Denies chest pain or SHOB.  ?

## 2021-05-28 NOTE — ED Provider Notes (Signed)
? ?[facility] ?Provider Note ? ? ? Event Date/Time  ? First MD Initiated Contact with Patient 05/28/21 1345   ?  (approximate) ? ? ?History  ? ?Anxiety ? ? ?HPI ? ?Austin Sexton is a 61 y.o. male with history of hypertension, back pain, anxiety and as listed in EMR presents to the emergency department for treatment and evaluation of worsening anxiety. He has been unable to sleep for several nights. He denies SI or HI. He takes hydroxyzine as needed, but it isn't helping right now. Reports recent death of his brother. ? ?  ? ? ?Physical Exam  ? ?Triage Vital Signs: ?ED Triage Vitals  ?Enc Vitals Group  ?   BP 05/28/21 1309 (!) 131/104  ?   Pulse Rate 05/28/21 1309 (!) 109  ?   Resp 05/28/21 1309 18  ?   Temp 05/28/21 1309 98.4 ?F (36.9 ?C)  ?   Temp src --   ?   SpO2 05/28/21 1309 99 %  ?   Weight 05/28/21 1346 202 lb 13.2 oz (92 kg)  ?   Height 05/28/21 1310 6\' 5"  (1.956 m)  ?   Head Circumference --   ?   Peak Flow --   ?   Pain Score 05/28/21 1310 0  ?   Pain Loc --   ?   Pain Edu? --   ?   Excl. in GC? --   ? ? ?Most recent vital signs: ?Vitals:  ? 05/28/21 1309 05/28/21 1359  ?BP: (!) 131/104 (!) 139/93  ?Pulse: (!) 109 93  ?Resp: 18 16  ?Temp: 98.4 ?F (36.9 ?C)   ?SpO2: 99% 96%  ? ? ?General: Awake, no distress.  ?CV:  Good peripheral perfusion.  ?Resp:  Normal effort.  ?Abd:  No distention.  ?Other:  Good eye contact, calm, normal affect ? ? ?ED Results / Procedures / Treatments  ? ?Labs ?(all labs ordered are listed, but only abnormal results are displayed) ?Labs Reviewed  ?COMPREHENSIVE METABOLIC PANEL - Abnormal; Notable for the following components:  ?    Result Value  ? Potassium 3.4 (*)   ? Glucose, Bld 110 (*)   ? All other components within normal limits  ?CBC WITH DIFFERENTIAL/PLATELET  ? ? ? ?EKG ? ? ? ? ?RADIOLOGY ? ?Image and radiology report reviewed by me. ? ? ? ?PROCEDURES: ? ?Critical Care performed: No ? ?Procedures ? ? ?MEDICATIONS ORDERED IN ED: ?Medications - No  data to display ? ? ?IMPRESSION / MDM / ASSESSMENT AND PLAN / ED COURSE  ? ?I have reviewed the triage note. ? ?61 year old male presenting to the emergency department for evaluation of worsening anxiety about a week after his brother's funeral.  Symptoms have kept him awake for the past several nights.  No relief with his prescribed hydroxyzine. ? ?Plan will be to prescribe him Ativan 0.5 mg to be taken at night.  If he needs something to help during the day he was encouraged to continue taking his hydroxyzine.  He has an appointment scheduled with his primary care provider and 3 days.  He was encouraged to keep that appointment as scheduled. ? ?  ? ? ?FINAL CLINICAL IMPRESSION(S) / ED DIAGNOSES  ? ?Final diagnoses:  ?Situational anxiety  ? ? ? ?Rx / DC Orders  ? ?ED Discharge Orders   ? ?      Ordered  ?  LORazepam (ATIVAN) 0.5 MG tablet  At bedtime PRN       ?  05/28/21 1353  ? ?  ?  ? ?  ? ? ? ?Note:  This document was prepared using Dragon voice recognition software and may include unintentional dictation errors. ?  Chinita Pester, FNP ?05/28/21 1417 ? ?  ?Jene Every, MD ?05/28/21 1526 ? ?

## 2021-06-12 ENCOUNTER — Encounter: Payer: Self-pay | Admitting: Intensive Care

## 2021-06-12 ENCOUNTER — Emergency Department
Admission: EM | Admit: 2021-06-12 | Discharge: 2021-06-12 | Disposition: A | Discharge status: Home / Self Care | Payer: Self-pay | Attending: Emergency Medicine | Admitting: Emergency Medicine

## 2021-06-12 ENCOUNTER — Other Ambulatory Visit: Payer: Self-pay

## 2021-06-12 ENCOUNTER — Emergency Department: Payer: Self-pay

## 2021-06-12 DIAGNOSIS — I10 Essential (primary) hypertension: Secondary | ICD-10-CM | POA: Insufficient documentation

## 2021-06-12 DIAGNOSIS — K645 Perianal venous thrombosis: Secondary | ICD-10-CM | POA: Insufficient documentation

## 2021-06-12 HISTORY — DX: Pure hypercholesterolemia, unspecified: E78.00

## 2021-06-12 HISTORY — DX: Anxiety disorder, unspecified: F41.9

## 2021-06-12 LAB — BASIC METABOLIC PANEL
Anion gap: 8 (ref 5–15)
BUN: 17 mg/dL (ref 6–20)
CO2: 28 mmol/L (ref 22–32)
Calcium: 9.2 mg/dL (ref 8.9–10.3)
Chloride: 103 mmol/L (ref 98–111)
Creatinine, Ser: 1.21 mg/dL (ref 0.61–1.24)
GFR, Estimated: >60 mL/min (ref >60)
Glucose, Bld: 99 mg/dL (ref 70–99)
Potassium: 4.5 mmol/L (ref 3.5–5.1)
Sodium: 139 mmol/L (ref 135–145)

## 2021-06-12 LAB — CBC WITH DIFFERENTIAL/PLATELET
Abs Immature Granulocytes: 0.03 10*3/uL (ref 0.00–0.07)
Basophils Absolute: 0 10*3/uL (ref 0.0–0.1)
Basophils Relative: 1 %
Eosinophils Absolute: 0.1 10*3/uL (ref 0.0–0.5)
Eosinophils Relative: 2 %
HCT: 38.4 % — ABNORMAL LOW (ref 39.0–52.0)
Hemoglobin: 12.9 g/dL — ABNORMAL LOW (ref 13.0–17.0)
Immature Granulocytes: 0 %
Lymphocytes Relative: 27 %
Lymphs Abs: 2.2 10*3/uL (ref 0.7–4.0)
MCH: 30 pg (ref 26.0–34.0)
MCHC: 33.6 g/dL (ref 30.0–36.0)
MCV: 89.3 fL (ref 80.0–100.0)
Monocytes Absolute: 0.5 10*3/uL (ref 0.1–1.0)
Monocytes Relative: 7 %
Neutro Abs: 5.3 10*3/uL (ref 1.7–7.7)
Neutrophils Relative %: 63 %
Platelets: 297 10*3/uL (ref 150–400)
RBC: 4.3 MIL/uL (ref 4.22–5.81)
RDW: 14.9 % (ref 11.5–15.5)
WBC: 8.3 10*3/uL (ref 4.0–10.5)
nRBC: 0 % (ref 0.0–0.2)

## 2021-06-12 MED ORDER — IOHEXOL 300 MG/ML  SOLN
100.0000 mL | Freq: Once | INTRAMUSCULAR | Status: AC | PRN
  Administered 2021-06-12: 100 mL via INTRAVENOUS
  Filled 2021-06-12: qty 100

## 2021-06-12 MED ORDER — HYDROCORTISONE ACETATE 25 MG RE SUPP
25.0000 mg | Freq: Once | RECTAL | Status: AC
  Administered 2021-06-12: 25 mg via RECTAL
  Filled 2021-06-12: qty 1

## 2021-06-12 MED ORDER — HYDROCODONE-ACETAMINOPHEN 5-325 MG PO TABS: 1.0000 | ORAL_TABLET | Freq: Three times a day (TID) | ORAL | 0 refills | Status: AC | PRN

## 2021-06-12 MED ORDER — HYDROCORTISONE ACETATE 25 MG RE SUPP: 25.0000 mg | Freq: Two times a day (BID) | RECTAL | 0 refills | Status: AC

## 2021-06-12 MED ORDER — NITROGLYCERIN 2 % TD OINT: 0.5000 [in_us] | TOPICAL_OINTMENT | Freq: Once | TRANSDERMAL | Status: DC

## 2021-06-12 MED ORDER — NITROGLYCERIN 2 % TD OINT
0.2500 [in_us] | TOPICAL_OINTMENT | Freq: Once | TRANSDERMAL | Status: AC
  Administered 2021-06-12: 0.25 [in_us] via TOPICAL
  Filled 2021-06-12: qty 1

## 2021-06-12 MED ORDER — DIBUCAINE (PERIANAL) 1 % EX OINT: 1.0000 "application " | TOPICAL_OINTMENT | Freq: Three times a day (TID) | CUTANEOUS | 0 refills | Status: DC | PRN

## 2021-06-12 MED ORDER — LIDOCAINE-EPINEPHRINE (PF) 1 %-1:200000 IJ SOLN
10.0000 mL | Freq: Once | INTRAMUSCULAR | Status: DC
  Filled 2021-06-12: qty 10

## 2021-06-12 NOTE — Discharge Instructions (Addendum)
Use the suppositories every 12 hours as directed. Use the topical pain gel up to 3 times a day. Use the remaining nitroglycerin paste 2 times a day, as needed. This medicine may cause a headache.Take the pain medicine as needed.  Follow-up with your provider or return if needed.  ?

## 2021-06-12 NOTE — ED Triage Notes (Signed)
Patient c/o abscess near his hemorrhoids. Denies drainage but swollen and sore. Present X2 weeks ?

## 2021-06-12 NOTE — ED Provider Notes (Signed)
? ? ?[facility] ?Emergency Department Provider Note ? ? ? ? Event Date/Time  ? First MD Initiated Contact with Patient 06/12/21 1656   ?  (approximate) ? ? ?History  ? ?Abscess ? ? ?HPI ? ?Austin Sexton is a 61 y.o. male with a history of hypertension, cholesterol, anxiety, and hemorrhoids presents to the ED for evaluation of acute rectal pain.  Patient presents what he notes is an abscess near his hemorrhoids has been present for the last 2 weeks.  Denies any spontaneous purulent drainage, but notes the area is tender and swollen.  He denies any fevers, chills, or sweats.  ? ? ?Physical Exam  ? ?Triage Vital Signs: ?ED Triage Vitals [06/12/21 1624]  ?Enc Vitals Group  ?   BP (!) 169/100  ?   Pulse Rate 71  ?   Resp 16  ?   Temp 98 ?F (36.7 ?C)  ?   Temp Source Oral  ?   SpO2 100 %  ?   Weight 206 lb (93.4 kg)  ?   Height 6' 5.5" (1.969 m)  ?   Head Circumference   ?   Peak Flow   ?   Pain Score 10  ?   Pain Loc   ?   Pain Edu?   ?   Excl. in GC?   ? ? ?Most recent vital signs: ?Vitals:  ? 06/12/21 1624  ?BP: (!) 169/100  ?Pulse: 71  ?Resp: 16  ?Temp: 98 ?F (36.7 ?C)  ?SpO2: 100%  ? ? ?General Awake, no distress.  ?CV:  Good peripheral perfusion.  ?RESP:  Normal effort.  ?ABD:  No distention.  Patient with normal rectal tone.  Patient with a firm quarter size domed area at the introitus of the rectum, which is tender and shows some signs of underlying blood clots.  No surrounding erythema, induration, or warmth. ? ? ?ED Results / Procedures / Treatments  ? ?Labs ?(all labs ordered are listed, but only abnormal results are displayed) ?Labs Reviewed  ?CBC WITH DIFFERENTIAL/PLATELET - Abnormal; Notable for the following components:  ?    Result Value  ? Hemoglobin 12.9 (*)   ? HCT 38.4 (*)   ? All other components within normal limits  ?BASIC METABOLIC PANEL  ? ? ? ?EKG ? ? ?RADIOLOGY ? ?I personally viewed and evaluated these images as part of my medical decision making, as well as reviewing  the written report by the radiologist. ? ?ED Provider Interpretation: no acute findings} ? ?CT PELVIS W CONTRAST ? ?Result Date: 06/12/2021 ?CLINICAL DATA:  ANAL/RECTAL ABSCESS. EXAM: CT PELVIS WITH CONTRAST TECHNIQUE: Multidetector CT imaging of the pelvis was performed using the standard protocol following the bolus administration of intravenous contrast. RADIATION DOSE REDUCTION: This exam was performed according to the departmental dose-optimization program which includes automated exposure control, adjustment of the mA and/or kV according to patient size and/or use of iterative reconstruction technique. CONTRAST:  OMNIPAQUE IOHEXOL 300 MG/ML  SOLN COMPARISON:  None Available. FINDINGS: Urinary Tract:  No abnormality visualized. Bowel: Numerous sigmoid and lower descending colon diverticula. No bowel dilation, wall thickening or inflammation. No diverticulitis. Normal appendix visualized. Vascular/Lymphatic: Mild infrarenal abdominal aortic atherosclerosis. No aneurysm. No enlarged lymph nodes. Reproductive:  Enlarged prostate measuring 5.8 x 4.8 x 6 cm. Other: No evidence of a perianal or perirectal abscess. No perineal inflammation. Musculoskeletal: No fracture or acute finding. No bone resorption to suggest osteomyelitis. No bone lesion. IMPRESSION: 1. No evidence  of a perianal or perirectal abscess or inflammation. 2. No acute findings. 3. Prostate hypertrophy. 4. Numerous sigmoid/left colon diverticula without evidence of diverticulitis. Electronically Signed   By: Amie Portland M.D.   On: 06/12/2021 18:36   ? ? ?PROCEDURES: ? ?Critical Care performed: No ? ?Procedures ? ? ?MEDICATIONS ORDERED IN ED: ?Medications  ?hydrocortisone (ANUSOL-HC) suppository 25 mg (has no administration in time range)  ?nitroGLYCERIN (NITROGLYN) 2 % ointment 0.25 inch (has no administration in time range)  ?iohexol (OMNIPAQUE) 300 MG/ML solution 100 mL (100 mLs Intravenous Contrast Given 06/12/21 1812)  ? ? ? ?IMPRESSION /  MDM / ASSESSMENT AND PLAN / ED COURSE  ?I reviewed the triage vital signs and the nursing notes. ?             ?               ? ?Differential diagnosis includes, but is not limited to, cellulitis, rectal abscess, pilonidal cyst ? ?Patient to the ED for evaluation of rectal pain and tenderness without reports of rectal bleeding.  He is evaluated for his complaints, and found to have a concerning soft tissue defect right at the rectum.  Patient labs are reassuring without signs of acute leukocytosis electrolyte abnormality.  CT imaging was performed which did reveal what is likely a thrombosed external hemorrhoid without evidence of a perirectal abscess.  Patient's diagnosis is consistent with thrombosed external hemorrhoid. Patient will be discharged home with prescriptions for Anusol, Dibucaine, and Norco (#9). Patient is to follow up with his primary provider as needed or otherwise directed. Patient is given ED precautions to return to the ED for any worsening or new symptoms. ? ? ?FINAL CLINICAL IMPRESSION(S) / ED DIAGNOSES  ? ?Final diagnoses:  ?Thrombosed external hemorrhoid  ? ? ? ?Rx / DC Orders  ? ?ED Discharge Orders   ? ?      Ordered  ?  hydrocortisone (ANUSOL-HC) 25 MG suppository  Every 12 hours       ? 06/12/21 1918  ?  dibucaine (NUPERCAINAL) 1 % OINT  3 times daily PRN       ? 06/12/21 1918  ?  HYDROcodone-acetaminophen (NORCO) 5-325 MG tablet  3 times daily PRN       ? 06/12/21 1919  ? ?  ?  ? ?  ? ? ? ?Note:  This document was prepared using Dragon voice recognition software and may include unintentional dictation errors. ? ?  ?Lissa Hoard, PA-C ?06/12/21 1928 ? ?  ?Georga Hacking, MD ?06/13/21 1059 ? ?

## 2021-06-12 NOTE — ED Notes (Signed)
IV placed, but not charted was removed prior to discharge. ?

## 2021-06-12 NOTE — ED Notes (Signed)
Pt to ED for abscess/boil to R anal area next to hemorrhoid. Ongoing since 2 weeks, 8/10 pain with sitting. ?

## 2022-07-19 IMAGING — CR DG CERVICAL SPINE COMPLETE 4+V
1 series · 6 of 6 positions shown · non-contrast
Comparison: 08/08/2012

CLINICAL DATA: Neck pain radiating to the left arm over the last 2
weeks.

EXAM:
CERVICAL SPINE - COMPLETE 4+ VIEW

[Series 1: dg cervical spine complete · 0.14mm/px · 6 of 6 slices shown]
[im 1/6]
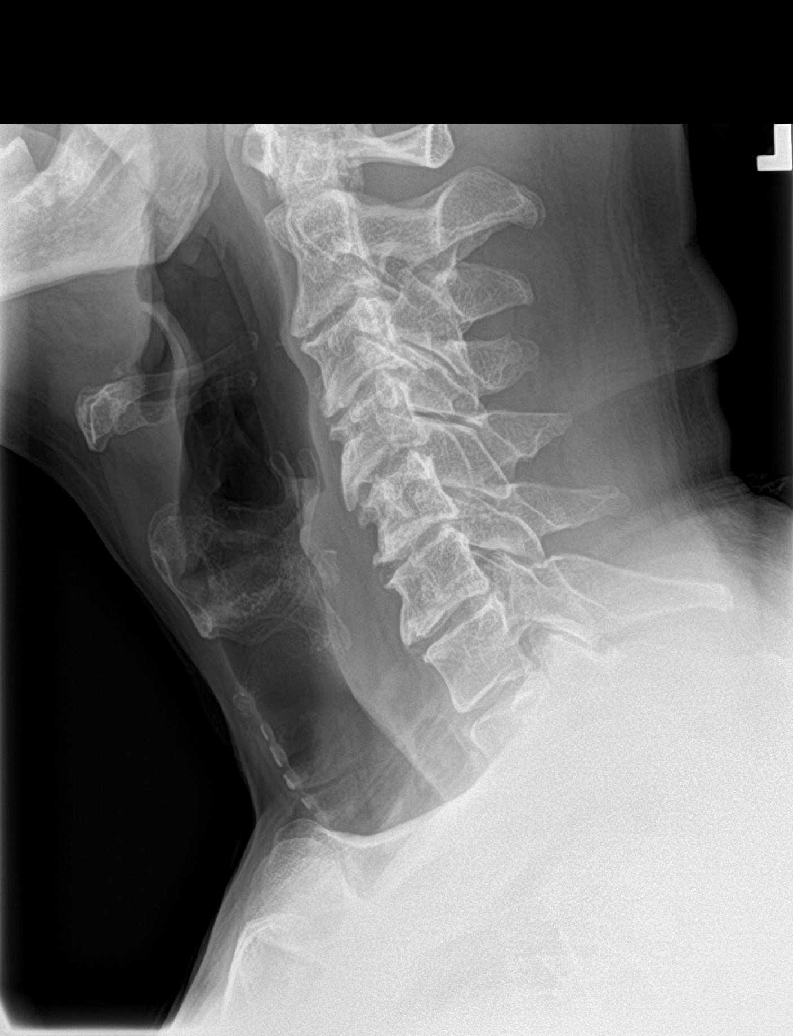
[im 2/6]
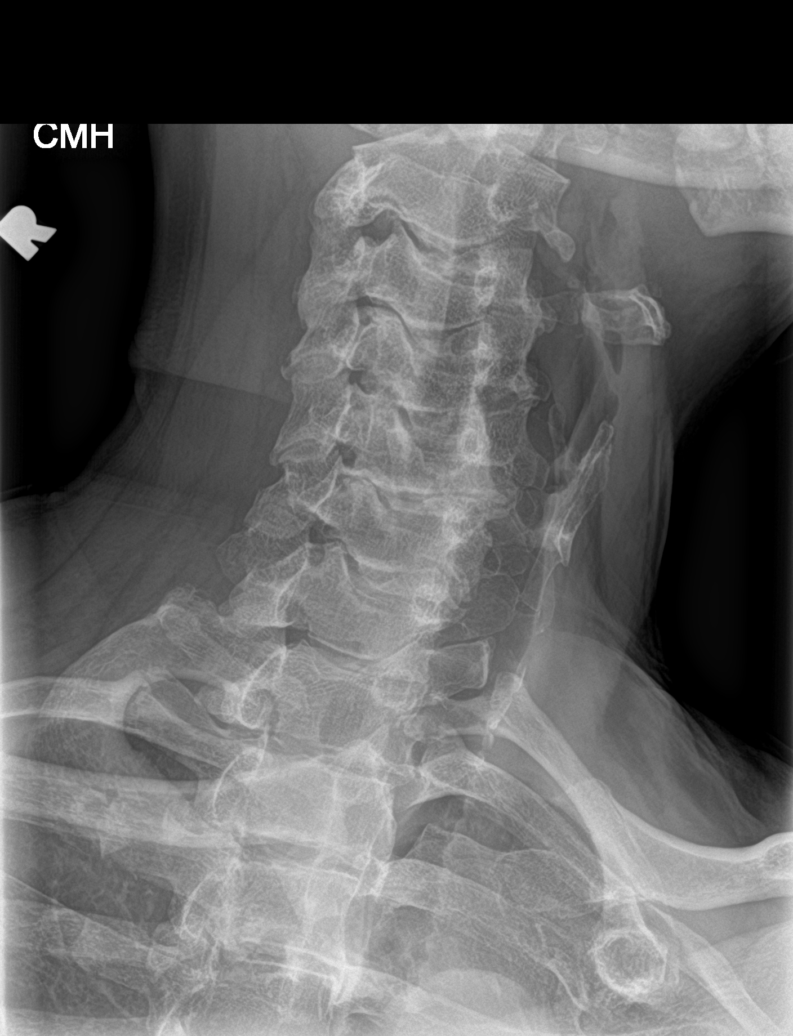
[im 3/6]
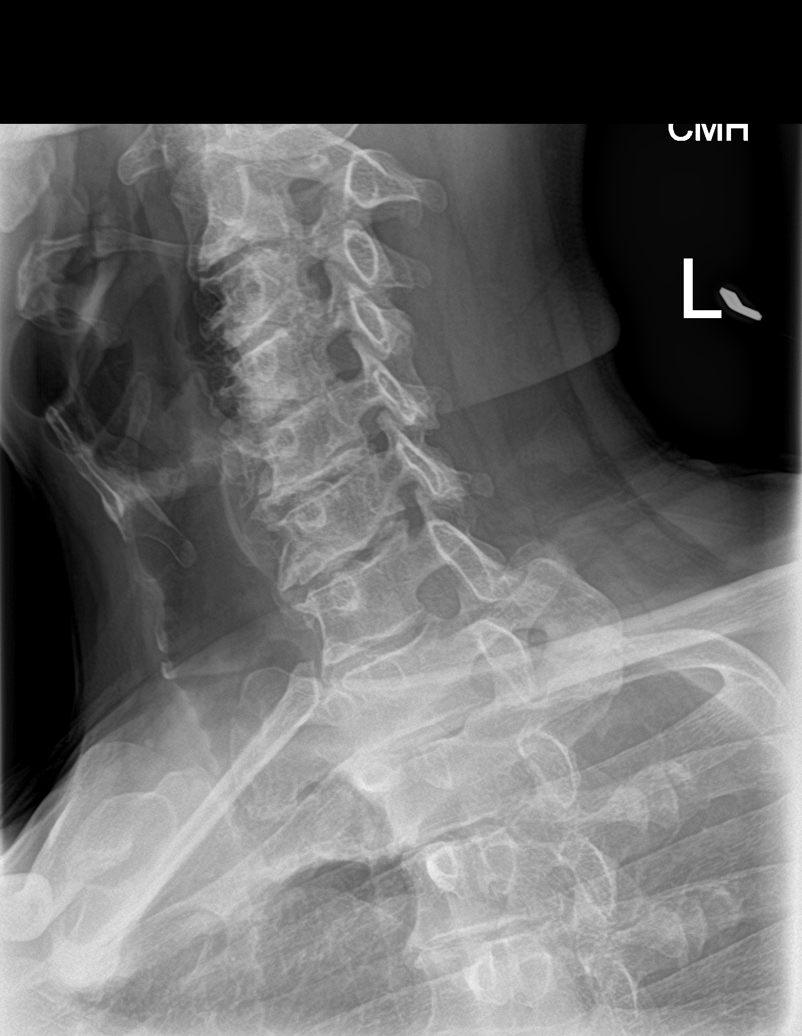
[im 4/6]
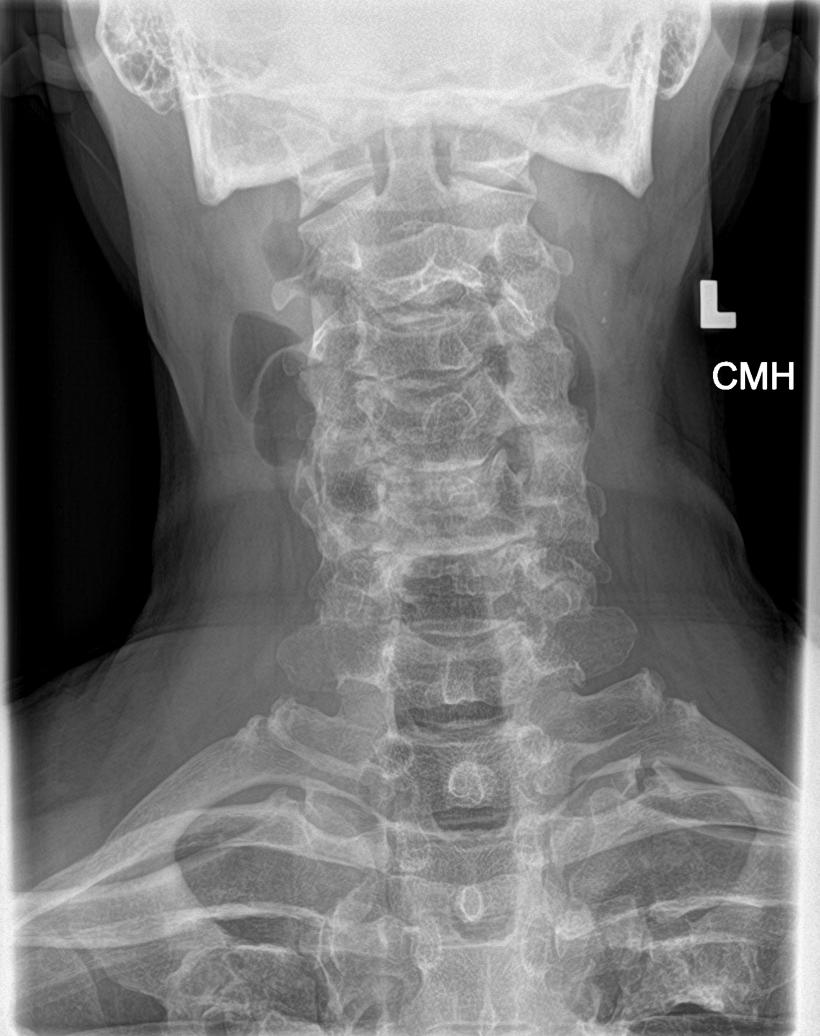
[im 5/6]
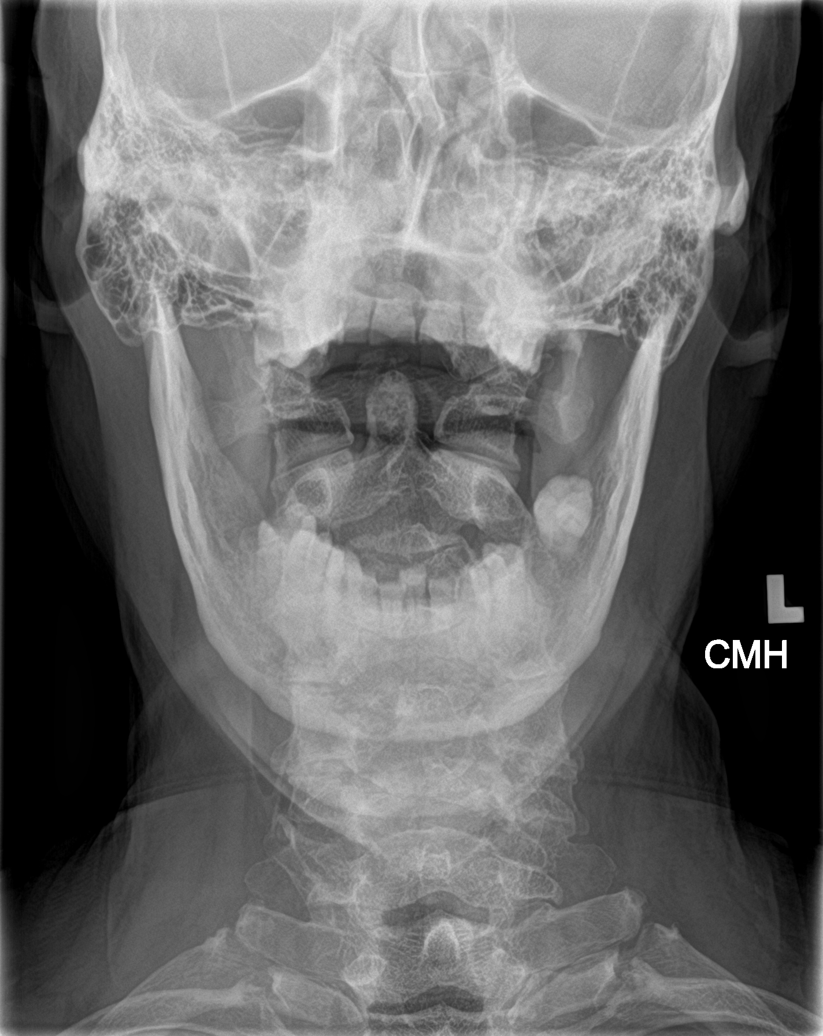
[im 6/6]
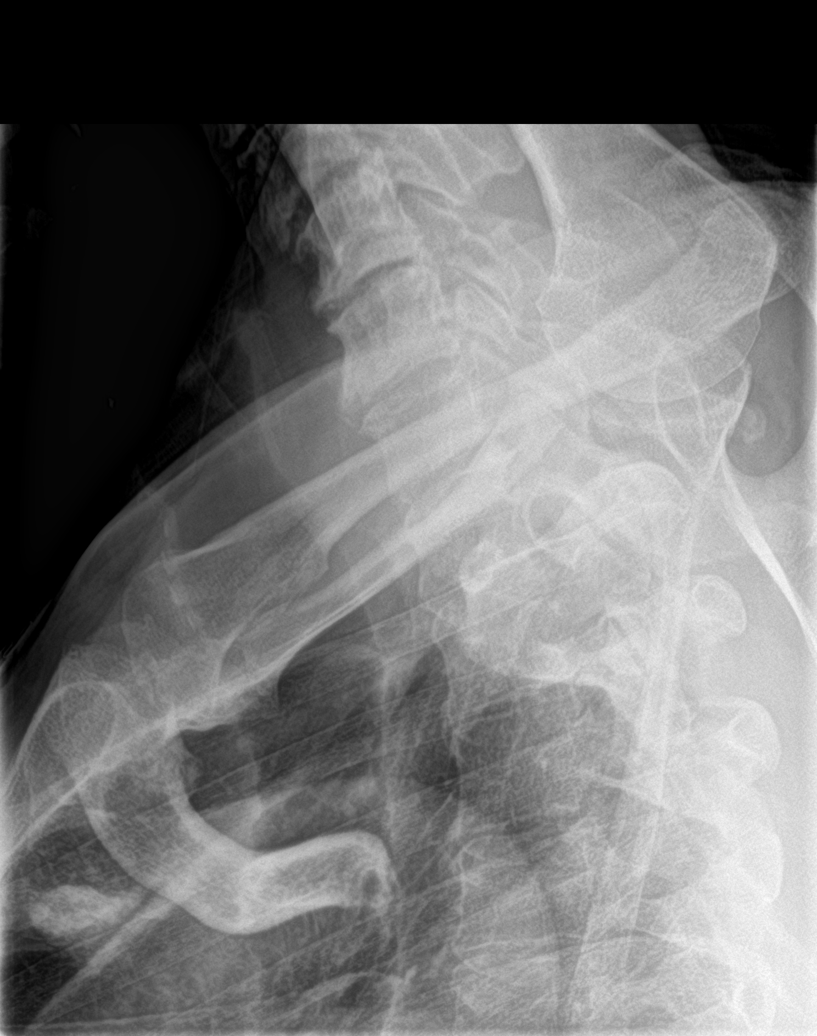

[6 of 6 positions shown; findings below may reference images not displayed]

FINDINGS: Normal alignment. Chronic degenerative spondylosis from C2-3 through
C6-7 with disc space narrowing and endplate osteophytes. Mild bony
foraminal encroachment bilaterally throughout that region. No
evidence of fracture or other focal finding.
IMPRESSION: Chronic degenerative spondylosis from C2-3 through C6-7. Mild bony
foraminal encroachment bilaterally throughout that region.

## 2022-08-31 DIAGNOSIS — Z1389 Encounter for screening for other disorder: Secondary | ICD-10-CM | POA: Diagnosis not present

## 2022-08-31 DIAGNOSIS — Z0131 Encounter for examination of blood pressure with abnormal findings: Secondary | ICD-10-CM | POA: Diagnosis not present

## 2022-08-31 DIAGNOSIS — Z0130 Encounter for examination of blood pressure without abnormal findings: Secondary | ICD-10-CM | POA: Diagnosis not present

## 2022-08-31 DIAGNOSIS — M519 Unspecified thoracic, thoracolumbar and lumbosacral intervertebral disc disorder: Secondary | ICD-10-CM | POA: Diagnosis not present

## 2022-08-31 DIAGNOSIS — J329 Chronic sinusitis, unspecified: Secondary | ICD-10-CM | POA: Diagnosis not present

## 2022-08-31 DIAGNOSIS — J309 Allergic rhinitis, unspecified: Secondary | ICD-10-CM | POA: Diagnosis not present

## 2022-10-06 DIAGNOSIS — Z0131 Encounter for examination of blood pressure with abnormal findings: Secondary | ICD-10-CM | POA: Diagnosis not present

## 2022-10-06 DIAGNOSIS — N390 Urinary tract infection, site not specified: Secondary | ICD-10-CM | POA: Diagnosis not present

## 2022-10-06 DIAGNOSIS — Z1389 Encounter for screening for other disorder: Secondary | ICD-10-CM | POA: Diagnosis not present

## 2022-10-06 DIAGNOSIS — J329 Chronic sinusitis, unspecified: Secondary | ICD-10-CM | POA: Diagnosis not present

## 2023-03-01 ENCOUNTER — Encounter: Payer: Self-pay | Admitting: Urology

## 2023-03-01 ENCOUNTER — Ambulatory Visit: Payer: 59 | Admitting: Urology

## 2023-03-01 VITALS — BP 142/90 | HR 106 | Ht 77.0 in | Wt 198.0 lb

## 2023-03-01 DIAGNOSIS — N5201 Erectile dysfunction due to arterial insufficiency: Secondary | ICD-10-CM

## 2023-03-01 DIAGNOSIS — R972 Elevated prostate specific antigen [PSA]: Secondary | ICD-10-CM | POA: Diagnosis not present

## 2023-03-01 DIAGNOSIS — N401 Enlarged prostate with lower urinary tract symptoms: Secondary | ICD-10-CM | POA: Diagnosis not present

## 2023-03-01 DIAGNOSIS — N529 Male erectile dysfunction, unspecified: Secondary | ICD-10-CM

## 2023-03-01 LAB — URINALYSIS, COMPLETE
Bilirubin, UA: NEGATIVE
Glucose, UA: NEGATIVE
Ketones, UA: NEGATIVE
Leukocytes,UA: NEGATIVE
Nitrite, UA: NEGATIVE
Protein,UA: NEGATIVE
RBC, UA: NEGATIVE
Specific Gravity, UA: 1.02 (ref 1.005–1.030)
Urobilinogen, Ur: 0.2 mg/dL (ref 0.2–1.0)
pH, UA: 5 (ref 5.0–7.5)

## 2023-03-01 LAB — MICROSCOPIC EXAMINATION

## 2023-03-01 MED ORDER — TADALAFIL 20 MG PO TABS: ORAL_TABLET | ORAL | 0 refills | Status: AC

## 2023-03-01 NOTE — Progress Notes (Signed)
I, Maysun Anabel Bene, acting as a scribe for Riki Altes, MD., have documented all relevant documentation on the behalf of Riki Altes, MD, as directed by Riki Altes, MD while in the presence of Riki Altes, MD.  03/01/2023 4:37 PM   Evern Bio Jun 26, 1960 [id number]  Referring provider: Abram Sander, MD [address] [place],  [place] [address]  Chief Complaint  Patient presents with   Establish Care   Elevated PSA    HPI: Austin Sexton is a 63 y.o. male referred for evaluation of an elevated PSA.   PSA drawn late December 2024 elevated at 7.1  No prior PSA.  Has occasional hesitancy and intermittent urinary stream is on tamsulosin 0.4 mg daily He gets partial erections, which are not firm enough for penetration, and was placed on tadalafil 5 mg daily, which has not been effective.  Family history of prostate cancer in his father.  Denies recurrent UTI, dysuia, or gross hematuria.    PMH: Past Medical History:  Diagnosis Date   Anxiety    Back pain    High cholesterol    Hypertension      Home Medications:  Allergies as of 03/01/2023   No Known Allergies      Medication List        Accurate as of March 01, 2023  4:37 PM. If you have any questions, ask your nurse or doctor.          STOP taking these medications    dibucaine 1 % Oint Commonly known as: NUPERCAINAL Stopped by: Riki Altes       TAKE these medications    gabapentin 600 MG tablet Commonly known as: NEURONTIN Take 600 mg by mouth 3 (three) times daily.   losartan-hydrochlorothiazide 50-12.5 MG tablet Commonly known as: HYZAAR Take 1 tablet by mouth daily.   tadalafil 20 MG tablet Commonly known as: CIALIS Take 1 tab 1 hour prior to intercourse What changed:  medication strength how much to take how to take this when to take this additional instructions Changed by: Riki Altes   tamsulosin 0.4 MG Caps capsule Commonly known as:  FLOMAX Take 0.4 mg by mouth daily.        Allergies: No Known Allergies  Social History:  reports that he has been smoking cigars and cigarettes. He has never used smokeless tobacco. He reports current alcohol use of about 2.0 standard drinks of alcohol per week. He reports that he does not use drugs.   Physical Exam: BP (!) 142/90   Pulse (!) 106   Ht 6\' 5"  (1.956 m)   Wt 198 lb (89.8 kg)   BMI 23.48 kg/m   Constitutional:  Alert and oriented, No acute distress. HEENT: [place] A Respiratory: Normal respiratory effort, no increased work of breathing. GU: Prostate 50 grams, smooth without nodules. Consistency is normal.  Psychiatric: Normal mood and affect.   Assessment & Plan:    1. Elevated PSA Although PSA is a prostate cancer screening test he was informed that cancer is not the most common cause of an elevated PSA. Other potential causes including BPH and inflammation were discussed.  He was informed that the only way to adequately diagnose prostate cancer would be transrectal ultrasound and biopsy of the prostate. The procedure was discussed including potential risks of bleeding and infection/sepsis. He was also informed that a negative biopsy does not conclusively rule out the possibility that prostate cancer may  be present and that continued monitoring is required.  The use of newer adjunctive blood and urine tests to predict the probability of high-grade prostate cancer were discussed.  The use of multiparametric prostate MRI to evaluate for lesions suspicious for high grade prostate cancer and aid in targeted bx was reviewed.  Continued periodic surveillance was also discussed.  His PSA was repeated today and if persistently elevated, will order a prostate MRI.   2. BPH with LUTS  Stable on tamsulosin/low dose tadalafil.   3. Erectile dysfunction He can continue low dose tadalafil and will add 20 mg demand therapy to take 1 hour prior to intercourse.   I have reviewed  the above documentation for accuracy and completeness, and I agree with the above.   Riki Altes, MD  [facility] [address], [address] [place], [place] [address] [phone]

## 2023-03-02 ENCOUNTER — Telehealth: Payer: Self-pay | Admitting: *Deleted

## 2023-03-02 ENCOUNTER — Other Ambulatory Visit: Payer: Self-pay | Admitting: *Deleted

## 2023-03-02 DIAGNOSIS — R972 Elevated prostate specific antigen [PSA]: Secondary | ICD-10-CM

## 2023-03-02 LAB — PSA: Prostate Specific Ag, Serum: 7.4 ng/mL — ABNORMAL HIGH (ref 0.0–4.0)

## 2023-03-02 NOTE — Telephone Encounter (Signed)
Mailed  prostate mri instruction to here.

## 2023-03-02 NOTE — Telephone Encounter (Signed)
Prostate MRI Prep:  1- No ejaculation 48 hours prior to exam  2- No caffeine or carbonated beverages on day of the exam  3- Eat light diet evening prior and day of exam  4- Avoid eating 4 hours prior to exam  5- Fleets enema needs to be done 4 hours prior to exam -See below. Can be purchased at the drug store.

## 2023-03-02 NOTE — Progress Notes (Signed)
Notified patient as instructed, mail instruction to patient

## 2023-03-23 ENCOUNTER — Ambulatory Visit: Admission: RE | Admit: 2023-03-23 | Payer: 59 | Source: Ambulatory Visit

## 2023-12-21 IMAGING — CT CT PELVIS W/ CM
2 of 4 series · 17 of 46 positions shown, 19 images · IV contrast (agent unspecified)
Comparison: None Available.

CLINICAL DATA: ANAL/RECTAL ABSCESS.

EXAM:
CT PELVIS WITH CONTRAST
TECHNIQUE: Multidetector CT imaging of the pelvis was performed using the
standard protocol following the bolus administration of intravenous
contrast.

[Series 3: soft tissue · axial · 0.74mm/px · z∈[-883,-589]mm · 14 of 169 slices shown, 16 images]
[im 11/169  soft-tissue]
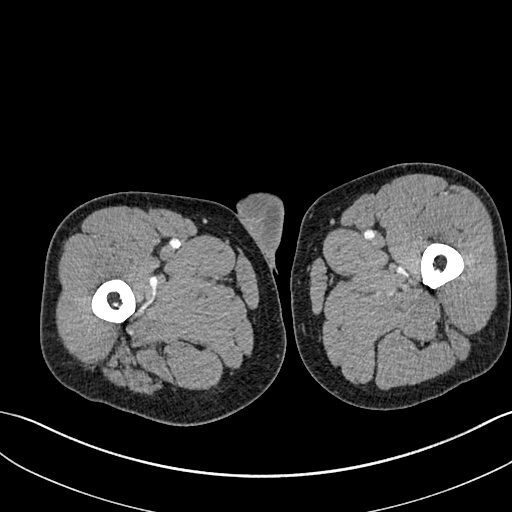
[im 11/169  bone]
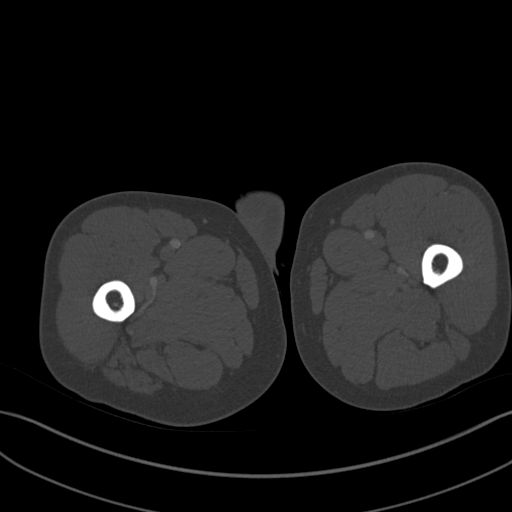
[im 22/169  soft-tissue]
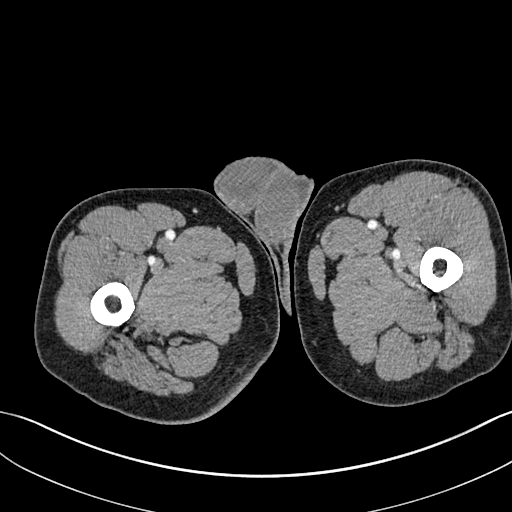
[im 33/169  soft-tissue]
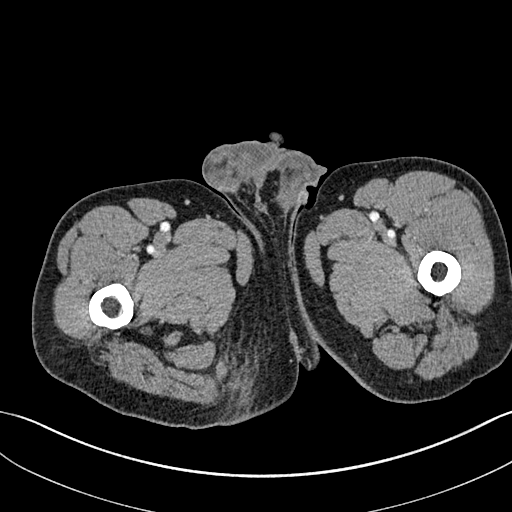
[im 44/169  soft-tissue]
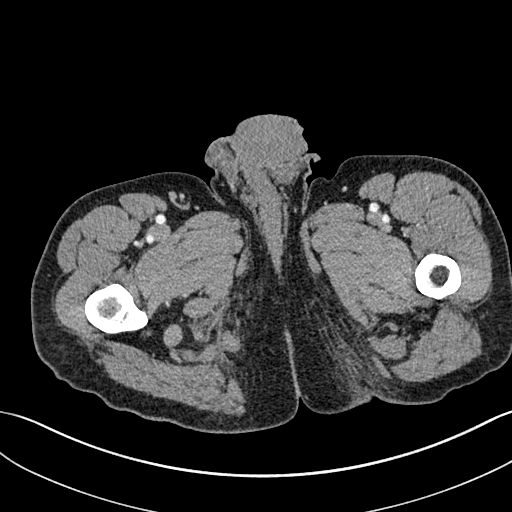
[im 55/169  soft-tissue]
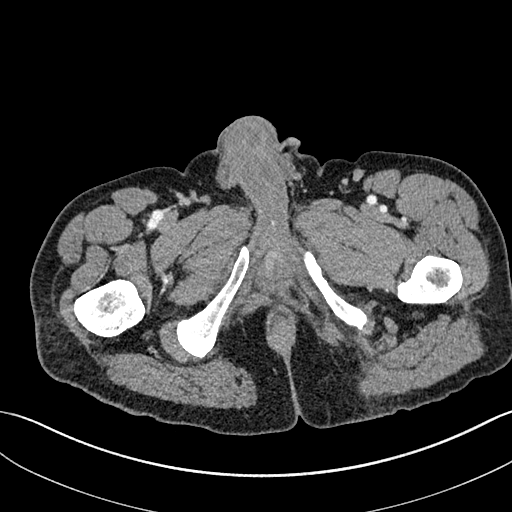
[im 66/169  soft-tissue]
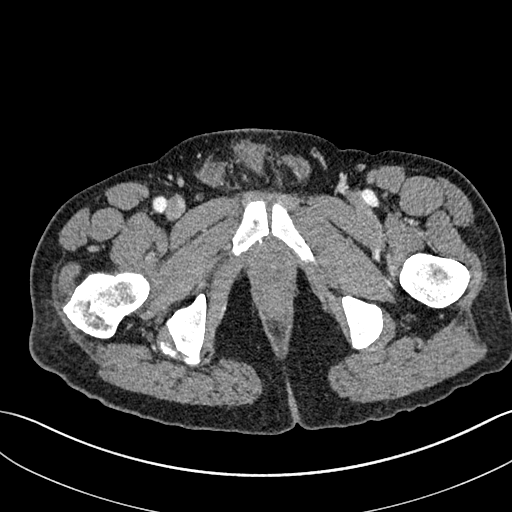
[im 76/169  soft-tissue]
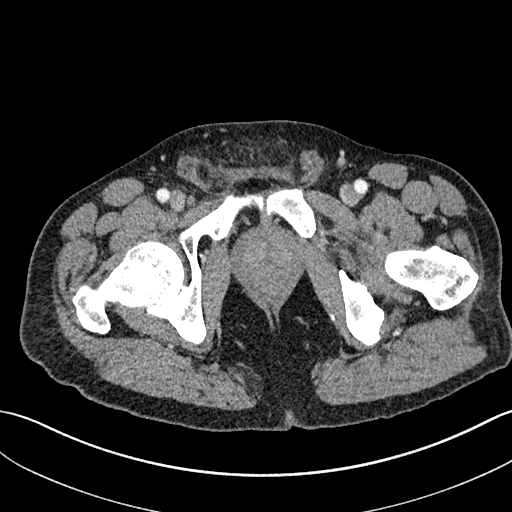
[im 93/169  soft-tissue]
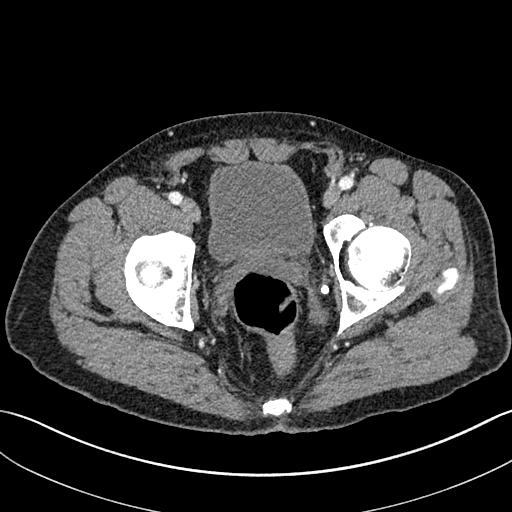
[im 103/169  soft-tissue]
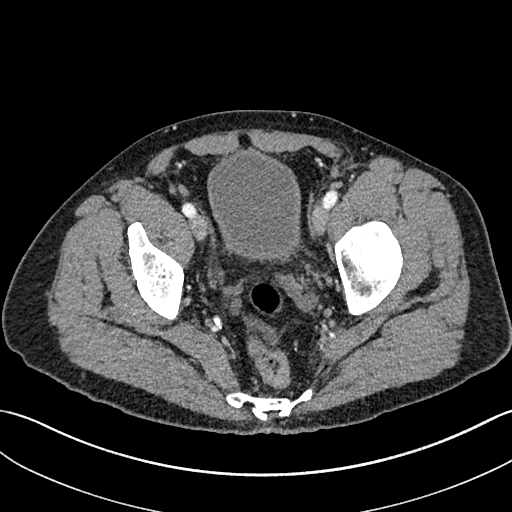
[im 103/169  bone]
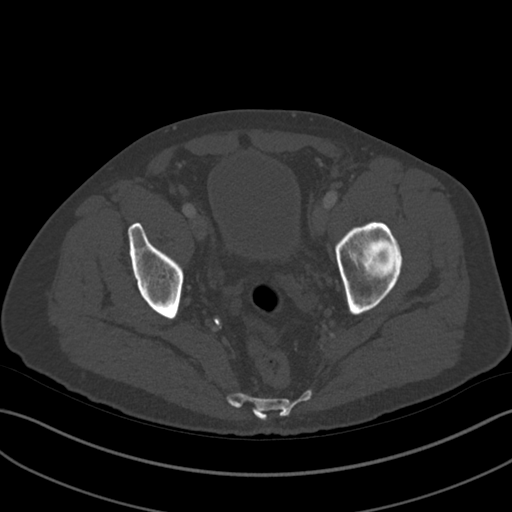
[im 114/169  soft-tissue]
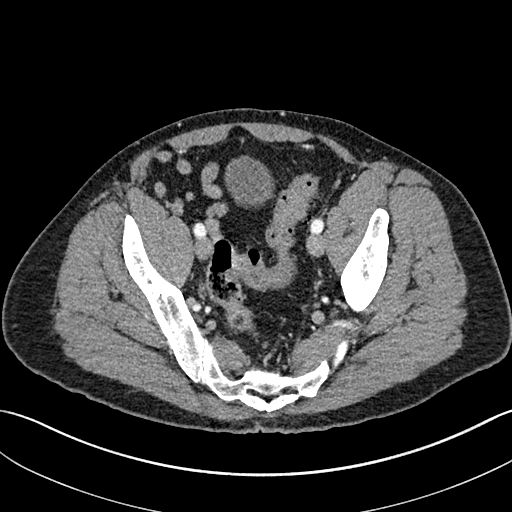
[im 125/169  soft-tissue]
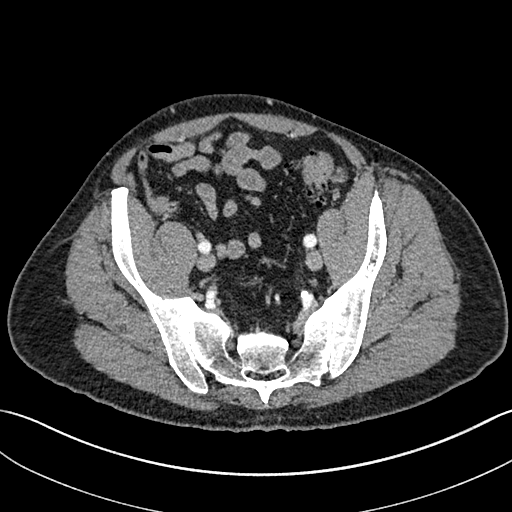
[im 136/169  soft-tissue]
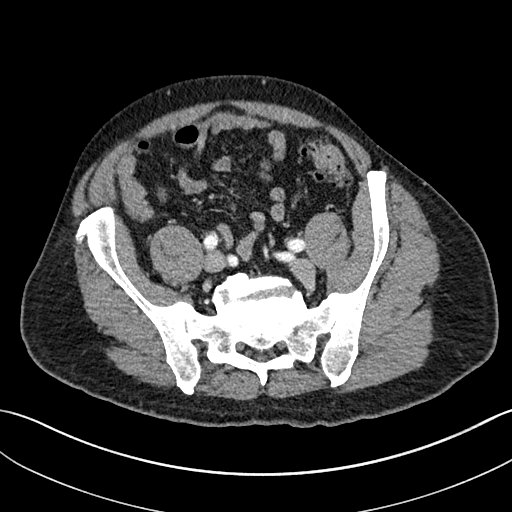
[im 147/169  soft-tissue]
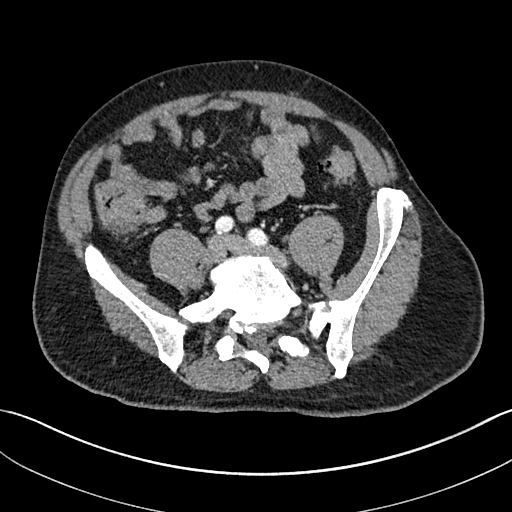
[im 158/169  soft-tissue]
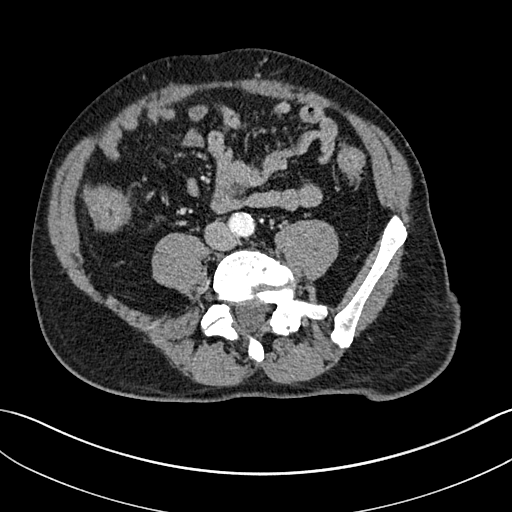

[Series 6: cor soft · coronal · 0.68mm/px · 3 of 149 slices shown]
[im 50/149  soft-tissue]
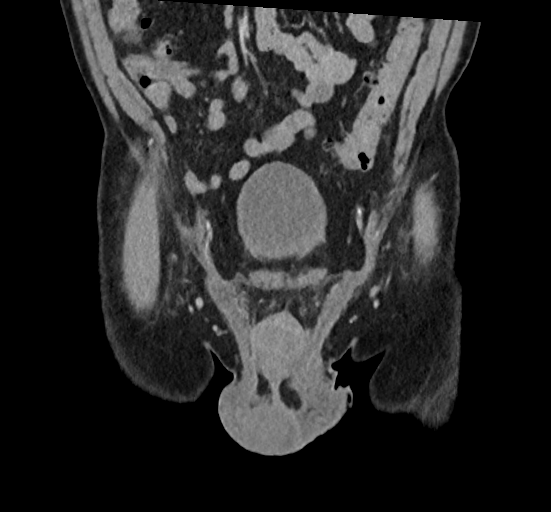
[im 66/149  soft-tissue]
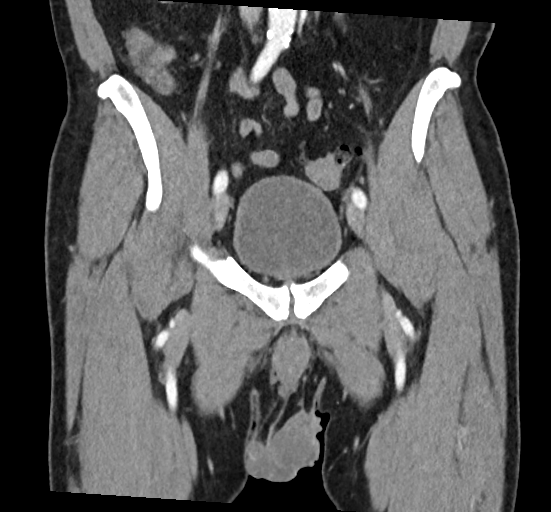
[im 83/149  soft-tissue]
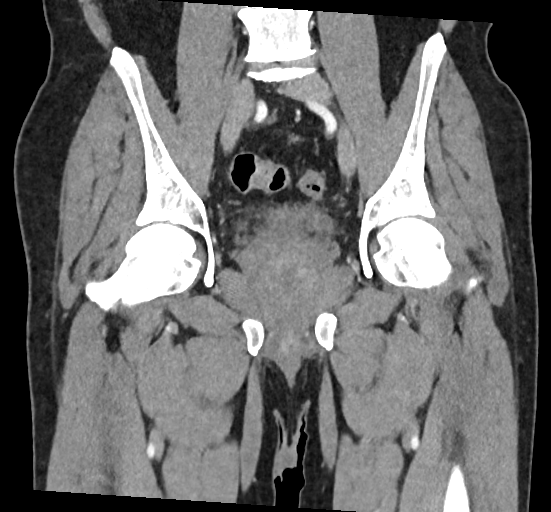

[17 of 46 positions shown; findings below may reference images not displayed]

RADIATION DOSE REDUCTION: This exam was performed according to the
departmental dose-optimization program which includes automated
exposure control, adjustment of the mA and/or kV according to
patient size and/or use of iterative reconstruction technique.

CONTRAST:  100mL OMNIPAQUE IOHEXOL 300 MG/ML  SOLN
FINDINGS: Urinary Tract:  No abnormality visualized.

Bowel: Numerous sigmoid and lower descending colon diverticula. No
bowel dilation, wall thickening or inflammation. No diverticulitis.
Normal appendix visualized.

Vascular/Lymphatic: Mild infrarenal abdominal aortic
atherosclerosis. No aneurysm. No enlarged lymph nodes.

Reproductive:  Enlarged prostate measuring 5.8 x 4.8 x 6 cm.

Other: No evidence of a perianal or perirectal abscess. No perineal
inflammation.

Musculoskeletal: No fracture or acute finding. No bone resorption to
suggest osteomyelitis. No bone lesion.
IMPRESSION: 1. No evidence of a perianal or perirectal abscess or inflammation.
2. No acute findings.
3. Prostate hypertrophy.
4. Numerous sigmoid/left colon diverticula without evidence of
diverticulitis.
